# Patient Record
Sex: Female | Born: 1980 | Race: Black or African American | Hispanic: No | Marital: Single | Smoking: Never smoker
Health system: Southern US, Community
[De-identification: ages and names within clinical notes are randomized; demographics above are authoritative.]

## PROBLEM LIST (undated history)

## (undated) DIAGNOSIS — G709 Myoneural disorder, unspecified: Secondary | ICD-10-CM

## (undated) DIAGNOSIS — R569 Unspecified convulsions: Secondary | ICD-10-CM

## (undated) DIAGNOSIS — K279 Peptic ulcer, site unspecified, unspecified as acute or chronic, without hemorrhage or perforation: Secondary | ICD-10-CM

## (undated) HISTORY — DX: Unspecified convulsions: R56.9

## (undated) HISTORY — DX: Myoneural disorder, unspecified: G70.9

## (undated) HISTORY — DX: Peptic ulcer, site unspecified, unspecified as acute or chronic, without hemorrhage or perforation: K27.9

## (undated) HISTORY — PX: APPENDECTOMY: SHX54

---

## 2012-12-10 ENCOUNTER — Telehealth: Payer: Self-pay | Admitting: Family Medicine

## 2012-12-11 ENCOUNTER — Telehealth: Payer: Self-pay | Admitting: Family Medicine

## 2012-12-11 NOTE — Telephone Encounter (Signed)
Appt given for tomorrow

## 2012-12-12 ENCOUNTER — Encounter: Payer: Self-pay | Admitting: Nurse Practitioner

## 2012-12-12 ENCOUNTER — Ambulatory Visit (INDEPENDENT_AMBULATORY_CARE_PROVIDER_SITE_OTHER): Payer: BC Managed Care – PPO | Admitting: Nurse Practitioner

## 2012-12-12 VITALS — BP 107/75 | HR 81 | Temp 98.5°F | Ht 67.0 in | Wt 150.0 lb

## 2012-12-12 DIAGNOSIS — R300 Dysuria: Secondary | ICD-10-CM

## 2012-12-12 DIAGNOSIS — R5381 Other malaise: Secondary | ICD-10-CM

## 2012-12-12 DIAGNOSIS — R631 Polydipsia: Secondary | ICD-10-CM

## 2012-12-12 DIAGNOSIS — R350 Frequency of micturition: Secondary | ICD-10-CM

## 2012-12-12 LAB — POCT URINALYSIS DIPSTICK
Glucose, UA: NEGATIVE
Nitrite, UA: NEGATIVE
Spec Grav, UA: 1.02
Urobilinogen, UA: NEGATIVE

## 2012-12-12 LAB — COMPLETE METABOLIC PANEL WITH GFR
ALT: 8 U/L (ref 0–35)
Albumin: 3.9 g/dL (ref 3.5–5.2)
CO2: 28 mEq/L (ref 19–32)
Calcium: 9.4 mg/dL (ref 8.4–10.5)
Chloride: 102 mEq/L (ref 96–112)
Creat: 0.6 mg/dL (ref 0.50–1.10)
GFR, Est African American: >89 mL/min
Potassium: 4.6 mEq/L (ref 3.5–5.3)
Sodium: 138 mEq/L (ref 135–145)
Total Protein: 7 g/dL (ref 6.0–8.3)

## 2012-12-12 LAB — POCT CBC
HCT, POC: 37.1 % — AB (ref 37.7–47.9)
MCH, POC: 29.8 pg (ref 27–31.2)
MCHC: 33.6 g/dL (ref 31.8–35.4)
MPV: 7.7 fL (ref 0–99.8)
POC Granulocyte: 4 (ref 2–6.9)
POC LYMPH PERCENT: 26.4 %L (ref 10–50)
RDW, POC: 13.3 %
WBC: 5.8 10*3/uL (ref 4.6–10.2)

## 2012-12-12 LAB — POCT UA - MICROSCOPIC ONLY
Crystals, Ur, HPF, POC: NEGATIVE
Yeast, UA: NEGATIVE

## 2012-12-12 LAB — POCT GLYCOSYLATED HEMOGLOBIN (HGB A1C): Hemoglobin A1C: 5.2

## 2012-12-12 NOTE — Progress Notes (Signed)
Subjective:    Patient ID: Jaycelynn Knickerbocker Grivas, female    DOB: [birth year], 32 y.o.   MRN: [id number]  HPI Patient in c/o dysuria that started 9 days ago- Went to urgent care and they said she did not have UTI but had large amounts of glucose in her urine    Review of Systems  Constitutional: Positive for appetite change (increased) and fatigue.  Respiratory: Negative.   Cardiovascular: Negative.   Gastrointestinal: Negative.   Endocrine: Positive for polydipsia, polyphagia and polyuria.  Genitourinary: Positive for dysuria, urgency and frequency.       Nocturia       Objective:   Physical Exam  Constitutional: She is oriented to person, place, and time. She appears well-developed and well-nourished.  HENT:  Nose: Nose normal.  Mouth/Throat: Oropharynx is clear and moist.  Eyes: EOM are normal.  Neck: Trachea normal, normal range of motion and full passive range of motion without pain. Neck supple. No JVD present. Carotid bruit is not present. No thyromegaly present.  Cardiovascular: Normal rate, regular rhythm, normal heart sounds and intact distal pulses.  Exam reveals no gallop and no friction rub.   No murmur heard. Pulmonary/Chest: Effort normal and breath sounds normal.  Abdominal: Soft. Bowel sounds are normal. She exhibits no distension and no mass. There is no tenderness.  Musculoskeletal: Normal range of motion.  Lymphadenopathy:    She has no cervical adenopathy.  Neurological: She is alert and oriented to person, place, and time. She has normal reflexes.  Skin: Skin is warm and dry.  Psychiatric: She has a normal mood and affect. Her behavior is normal. Judgment and thought content normal.    BP 107/75  Pulse 81  Temp(Src) 98.5 F (36.9 C) (Oral)  Ht 5\' 7"  (1.702 m)  Wt 150 lb (68.04 kg)  BMI 23.49 kg/m2  LMP 11/28/2012  Results for orders placed in visit on 12/12/12  POCT CBC      Result Value Range   WBC 5.8  4.6 - 10.2 K/uL   Lymph, poc 1.5  0.6 - 3.4   POC LYMPH PERCENT 26.4  10 - 50 %L   POC Granulocyte 4.0  2 - 6.9   Granulocyte percent 68.2  37 - 80 %G   RBC 4.2  4.04 - 5.48 M/uL   Hemoglobin 12.5  12.2 - 16.2 g/dL   HCT, POC 40.9 (*) 81.1 - 47.9 %   MCV 88.6  80 - 97 fL   MCH, POC 29.8  27 - 31.2 pg   MCHC 33.6  31.8 - 35.4 g/dL   RDW, POC 91.4     Platelet Count, POC 186.0  142 - 424 K/uL   MPV 7.7  0 - 99.8 fL  POCT GLYCOSYLATED HEMOGLOBIN (HGB A1C)      Result Value Range   Hemoglobin A1C 5.2%    POCT UA - MICROSCOPIC ONLY      Result Value Range   WBC, Ur, HPF, POC occ     RBC, urine, microscopic 10-20     Bacteria, U Microscopic mod     Mucus, UA mod     Epithelial cells, urine per micros few     Crystals, Ur, HPF, POC neg     Casts, Ur, LPF, POC occ     Yeast, UA neg    POCT URINALYSIS DIPSTICK      Result Value Range   Color, UA yellow     Clarity, UA clear  Glucose, UA neg     Bilirubin, UA neg     Ketones, UA small     Spec Grav, UA 1.020     Blood, UA trace     pH, UA 7.0     Protein, UA small     Urobilinogen, UA negative     Nitrite, UA neg     Leukocytes, UA Trace          Assessment & Plan:   1. Other malaise and fatigue   2. Dysuria   3. Excessive thirst   4. Frequent urination    Orders Placed This Encounter  Procedures  . COMPLETE METABOLIC PANEL WITH GFR  . Thyroid Panel With TSH  . NMR Lipoprofile with Lipids  . POCT CBC  . POCT glycosylated hemoglobin (Hb A1C)  . POCT UA - Microscopic Only  . POCT urinalysis dipstick   Continue macrobid as ordered AZO OTC if needed Wait and see if symptoms resolve Labs pending  Mary-Margaret Daphine Deutscher, FNP

## 2012-12-12 NOTE — Patient Instructions (Signed)
Urinary Tract Infection  Urinary tract infections (UTIs) can develop anywhere along your urinary tract. Your urinary tract is your body's drainage system for removing wastes and extra water. Your urinary tract includes two kidneys, two ureters, a bladder, and a urethra. Your kidneys are a pair of bean-shaped organs. Each kidney is about the size of your fist. They are located below your ribs, one on each side of your spine.  CAUSES  Infections are caused by microbes, which are microscopic organisms, including fungi, viruses, and bacteria. These organisms are so small that they can only be seen through a microscope. Bacteria are the microbes that most commonly cause UTIs.  SYMPTOMS   Symptoms of UTIs may vary by age and gender of the patient and by the location of the infection. Symptoms in young women typically include a frequent and intense urge to urinate and a painful, burning feeling in the bladder or urethra during urination. Older women and men are more likely to be tired, shaky, and weak and have muscle aches and abdominal pain. A fever may mean the infection is in your kidneys. Other symptoms of a kidney infection include pain in your back or sides below the ribs, nausea, and vomiting.  DIAGNOSIS  To diagnose a UTI, your caregiver will ask you about your symptoms. Your caregiver also will ask to provide a urine sample. The urine sample will be tested for bacteria and white blood cells. White blood cells are made by your body to help fight infection.  TREATMENT   Typically, UTIs can be treated with medication. Because most UTIs are caused by a bacterial infection, they usually can be treated with the use of antibiotics. The choice of antibiotic and length of treatment depend on your symptoms and the type of bacteria causing your infection.  HOME CARE INSTRUCTIONS   If you were prescribed antibiotics, take them exactly as your caregiver instructs you. Finish the medication even if you feel better after you  have only taken some of the medication.   Drink enough water and fluids to keep your urine clear or pale yellow.   Avoid caffeine, tea, and carbonated beverages. They tend to irritate your bladder.   Empty your bladder often. Avoid holding urine for long periods of time.   Empty your bladder before and after sexual intercourse.   After a bowel movement, women should cleanse from front to back. Use each tissue only once.  SEEK MEDICAL CARE IF:    You have back pain.   You develop a fever.   Your symptoms do not begin to resolve within 3 days.  SEEK IMMEDIATE MEDICAL CARE IF:    You have severe back pain or lower abdominal pain.   You develop chills.   You have nausea or vomiting.   You have continued burning or discomfort with urination.  MAKE SURE YOU:    Understand these instructions.   Will watch your condition.   Will get help right away if you are not doing well or get worse.  Document Released: 04/05/2005 Document Revised: 12/26/2011 Document Reviewed: 08/04/2011  [employer] Patient Information 2014 [employer].

## 2012-12-13 LAB — NMR LIPOPROFILE WITH LIPIDS
Cholesterol, Total: 197 mg/dL (ref <200)
HDL Particle Number: 37.9 umol/L (ref >=30.5)
HDL-C: 65 mg/dL (ref >=40)
LDL (calc): 123 mg/dL — ABNORMAL HIGH (ref <100)
LDL Particle Number: 1545 nmol/L — ABNORMAL HIGH (ref <1000)
LP-IR Score: <25 (ref <=45)
Triglycerides: 46 mg/dL (ref <150)
VLDL Size: 33 nm (ref <=46.6)

## 2012-12-16 ENCOUNTER — Ambulatory Visit: Payer: Self-pay | Admitting: Family Medicine

## 2012-12-17 ENCOUNTER — Telehealth: Payer: Self-pay | Admitting: Nurse Practitioner

## 2012-12-17 NOTE — Telephone Encounter (Signed)
Error

## 2012-12-17 NOTE — Telephone Encounter (Signed)
Labs discussed with pt 

## 2013-01-20 ENCOUNTER — Telehealth: Payer: Self-pay

## 2013-01-20 DIAGNOSIS — G40909 Epilepsy, unspecified, not intractable, without status epilepticus: Secondary | ICD-10-CM

## 2013-01-20 NOTE — Telephone Encounter (Signed)
Patient aware referral in progress

## 2013-01-20 NOTE — Telephone Encounter (Signed)
Wants a referral to [facility]

## 2013-01-20 NOTE — Telephone Encounter (Signed)
Referral made 

## 2013-01-20 NOTE — Telephone Encounter (Signed)
She has epilepsy and she needs neurologist to help and she just moved here about 6 months ago

## 2013-01-20 NOTE — Telephone Encounter (Signed)
Needs to know for what?

## 2013-02-20 ENCOUNTER — Ambulatory Visit: Payer: BC Managed Care – PPO | Admitting: Neurology

## 2013-02-28 ENCOUNTER — Encounter: Payer: Self-pay | Admitting: Neurology

## 2013-02-28 ENCOUNTER — Ambulatory Visit (INDEPENDENT_AMBULATORY_CARE_PROVIDER_SITE_OTHER): Payer: BC Managed Care – PPO | Admitting: Neurology

## 2013-02-28 VITALS — BP 112/70 | HR 74 | Ht 67.0 in | Wt 155.0 lb

## 2013-02-28 DIAGNOSIS — G40909 Epilepsy, unspecified, not intractable, without status epilepticus: Secondary | ICD-10-CM | POA: Insufficient documentation

## 2013-02-28 MED ORDER — LEVETIRACETAM 750 MG PO TABS: 750.0000 mg | ORAL_TABLET | Freq: Two times a day (BID) | ORAL | Status: DC

## 2013-02-28 NOTE — Patient Instructions (Addendum)
Overall you are doing fairly well but I do want to suggest a few things today:   Remember to drink plenty of fluid, eat healthy meals and do not skip any meals. Try to eat protein with a every meal and eat a healthy snack such as fruit or nuts in between meals. Try to keep a regular sleep-wake schedule and try to exercise daily, particularly in the form of walking, 20-30 minutes a day, if you can.   As far as your medications are concerned, I would like to suggest starting a medication called called Keppra. Start by taking 750mg  twice a day. We will also taper you off the Depakote using the following schedule:  Week 1: Take Keppra 750mg  twice a day AND Depakote 500mg  twice a day Week 2: Take Keppra 750mg  twice a day AND Depakote 500mg  once a day Week 3: Take Keppra 750mg  twice a day and Depakote 500mg  every other day Week 4: Take Keppra 750mg  twice a day and discontinue Depakote  I would like to see you back in 6 months, sooner if we need to. Please call us with any interim questions, concerns, problems, updates or refill requests.   My clinical assistant and will answer any of your questions and relay your messages to me and also relay most of my messages to you.   Our phone number is [phone]. We also have an after hours call service for urgent matters and there is a physician on-call for urgent questions. For any emergencies you know to call 911 or go to the nearest emergency room

## 2013-02-28 NOTE — Progress Notes (Signed)
[facility]  Provider:  Dr Hosie Poisson Referring Provider: Bennie Pierini, * Primary Care Physician:  [name], MD  CC:  seizures  HPI:  Jeanette Gomez is a 32 y.o. female here as a referral from Dr. Daphine Deutscher for history of epilepsy  She reports being diagnosed with seizures around 25 years ago, describes them as generalized tonic-clonic in nature. She has not had a seizure in over 10 years. Prior to being well controlled to typically run 1 seizure per week, thinks it lasted around one minute. She describes it as generalized, but notes that she was awake and conscious during the whole event. She will to hear people talking but would be unable to communicate with them. She was initially followed by Dr. Sharene Skeans, head imaging EEG reports which per the patient were unremarkable. She was initially tried on May oral medications and switch to another pill, is unsure of the names of these medications but states it did not work. At some point she was started on Depakote 500 mg daily, noted some benefit with this and was then increased to 1 g daily. She has been overall seizure free since being on this medication.  She does express some concern over the possibility of getting pregnant and the risks of Depakote and would like to discuss this. Currently has no plans of getting pregnant but doesn't future. Has a family history of seizures, no febrile seizures. No history of head trauma.  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 Denies alcohol or tobacco use. Reports she had blood work around a year ago which was reported to be normal.  On VPA ER Review of Systems: Out of a complete 14 system review, the patient complains of only the following symptoms, and all other reviewed systems are negative. Other for fatigue feeling cold joint pain seizure  History   Social History  . Marital Status: Single    Spouse Name: N/A    Number of Children: 0  . 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 Smoking status: Never Smoker   . Smokeless tobacco: Never Used  . Alcohol Use: No  . Drug Use: No  . Sexual Activity: Not on file   Other Topics Concern  . Not on file   Social History Narrative   Patient is single and lives at home with her mother. Patient has a college education. B.S. 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   Caffeine- Four times  A week - Coffee    Family History  Problem Relation Age of Onset  . Hyperlipidemia Mother   . Hypertension Mother   . Stroke Father   . Diabetes Maternal Grandmother     Past Medical History  Diagnosis Date  . Seizures   . Neuromuscular disorder     myofaciasal pain    Past Surgical History  Procedure Laterality Date  . Appendectomy      Current Outpatient Prescriptions  Medication Sig Dispense Refill  . divalproex (DEPAKOTE ER) 500 MG 24 hr tablet Take 1,000 mg by mouth daily.      . traMADol (ULTRAM) 50 MG tablet Take 100 mg by mouth every 4 (four) hours as needed for pain.       No current facility-administered medications for this visit.    Allergies as of 02/28/2013 - Review Complete 02/28/2013  Allergen Reaction Noted  . Penicillins  12/12/2012  Vitals: BP 112/70  Pulse 74  Ht 5\' 7"  (1.702 m)  Wt 155 lb (70.308 kg)  BMI 24.27 kg/m2 Last Weight:  Wt Readings from Last 1 Encounters:  02/28/13 155 lb (70.308 kg)   Last Height:   Ht Readings from Last 1 Encounters:  02/28/13 5\' 7"  (1.702 m)     Physical exam: Exam: Gen: NAD, conversant Eyes: anicteric sclerae, moist conjunctivae HENT: Atraumati Neck: Trachea midline; supple,  Lungs: CTA, no wheezing, rales, rhonic                          CV: RRR, no MRG Abdomen: Soft, non-tender;  Extremities: No peripheral edema  Skin: Normal temperature, no rash,  Psych: Appropriate affect, pleasant  Neuro: MS: AA&Ox3, appropriately interactive, normal affect    Speech: fluent w/o paraphasic error  Memory: good recent and remote recall  CN: PERRL, EOMI no nystagmus, no ptosis, sensation intact to LT V1-V3 bilat, face symmetric, no weakness, hearing grossly intact, palate elevates symmetrically, shoulder shrug 5/5 bilat,  tongue protrudes midline, no fasiculations noted.  Motor: normal bulk and tone Strength: 5/5  In all extremities  Coord: rapid alternating and point-to-point (FNF, HTS) movements intact.  Reflexes: symmetrical, bilat downgoing toes  Sens: LT intact in all extremities  Gait: posture, stance, stride and arm-swing normal. Tandem gait intact. Able to walk on heels and toes. Romberg absent.   Assessment:  After physical and neurologic examination, review of laboratory studies, imaging, neurophysiology testing and pre-existing records, assessment will be reviewed on the problem list.  Plan:  Treatment plan and additional workup will be reviewed under Problem List.  Ms. Jeanette Gomez is a pleasant 32 year old African American woman who presents for initial evaluation of a history of epilepsy. She has been seizure-free for over 10 years on a regimen of Depakote 500 mg ER twice a day. Her physical exam is unremarkable this time. The etiology of her underlying seizure disorder is unclear, she reports having had a normal brain imaging and EEG. At today's visit she expresses concern over the possibility return in the future and the risk of being on Depakote versus the risks of coming off in her breakthrough seizures. After a long discussion we decided the benefit of trying to switch her to medication such as Keppra was work the risk.  1)Epilepsy -Will slowly transition patient from Depakote to Keppra 750 mg twice a day -Patient scheduled to followup in 6 months, earlier if needed.

## 2013-04-02 ENCOUNTER — Encounter: Payer: Self-pay | Admitting: General Practice

## 2013-04-02 ENCOUNTER — Ambulatory Visit (INDEPENDENT_AMBULATORY_CARE_PROVIDER_SITE_OTHER): Payer: BC Managed Care – PPO | Admitting: General Practice

## 2013-04-02 VITALS — BP 110/68 | HR 70 | Temp 97.9°F | Ht 67.0 in | Wt 151.0 lb

## 2013-04-02 DIAGNOSIS — J309 Allergic rhinitis, unspecified: Secondary | ICD-10-CM

## 2013-04-02 DIAGNOSIS — J029 Acute pharyngitis, unspecified: Secondary | ICD-10-CM

## 2013-04-02 DIAGNOSIS — J302 Other seasonal allergic rhinitis: Secondary | ICD-10-CM

## 2013-04-02 DIAGNOSIS — R509 Fever, unspecified: Secondary | ICD-10-CM

## 2013-04-02 LAB — POCT RAPID STREP A (OFFICE): Rapid Strep A Screen: NEGATIVE

## 2013-04-02 LAB — POCT INFLUENZA A/B: Influenza B, POC: NEGATIVE

## 2013-04-02 MED ORDER — CETIRIZINE HCL 10 MG PO TABS: 10.0000 mg | ORAL_TABLET | Freq: Every day | ORAL | Status: DC

## 2013-04-02 NOTE — Progress Notes (Signed)
  Subjective:    Patient ID: Jeanette Gomez, female    DOB: [birth year], 32 y.o.   MRN: [id number]  Fever  This is a new problem. The current episode started today. The problem occurs constantly. The problem has been unchanged. Her temperature was unmeasured prior to arrival. The temperature was taken using an oral thermometer. Associated symptoms include muscle aches and a sore throat. Pertinent negatives include no abdominal pain, chest pain, congestion, coughing, ear pain, headaches, nausea or rash. She has tried nothing for the symptoms.      Review of Systems  Constitutional: Positive for fever and chills.  HENT: Positive for sore throat. Negative for ear pain, congestion, rhinorrhea, neck pain, neck stiffness and postnasal drip.   Respiratory: Negative for cough and chest tightness.   Cardiovascular: Negative for chest pain and palpitations.  Gastrointestinal: Negative for nausea and abdominal pain.  Genitourinary: Negative for dysuria and difficulty urinating.  Skin: Negative for rash.  Neurological: Negative for dizziness, weakness and headaches.       Objective:   Physical Exam  Constitutional: She is oriented to person, place, and time. She appears well-developed and well-nourished.  HENT:  Head: Normocephalic and atraumatic.  Right Ear: External ear normal.  Left Ear: External ear normal.  Mouth/Throat: Posterior oropharyngeal erythema present.  Cardiovascular: Normal rate, regular rhythm and normal heart sounds.   Pulmonary/Chest: Effort normal and breath sounds normal. No respiratory distress. She exhibits no tenderness.  Neurological: She is alert and oriented to person, place, and time.  Skin: Skin is warm and dry.  Psychiatric: She has a normal mood and affect.    Results for orders placed in visit on 04/02/13  POCT INFLUENZA A/B      Result Value Range   Influenza A, POC Negative     Influenza B, POC Negative    POCT RAPID STREP A (OFFICE)      Result Value  Range   Rapid Strep A Screen Negative  Negative        Assessment & Plan:  1. Fever and chills - POCT Influenza A/B - POCT rapid strep A  2. Sore throat - POCT Influenza A/B - POCT rapid strep A  3. Seasonal allergies - cetirizine (ZYRTEC) 10 MG tablet; Take 1 tablet (10 mg total) by mouth daily.  Dispense: 30 tablet; Refill: 6 -RTO if symptoms worsen or unresolved -Patient verbalized understanding Coralie Keens, FNP-C

## 2013-05-15 ENCOUNTER — Other Ambulatory Visit: Payer: Self-pay

## 2013-09-03 ENCOUNTER — Ambulatory Visit: Payer: BC Managed Care – PPO | Admitting: Neurology

## 2013-09-19 ENCOUNTER — Ambulatory Visit (INDEPENDENT_AMBULATORY_CARE_PROVIDER_SITE_OTHER): Payer: BC Managed Care – PPO | Admitting: Neurology

## 2013-09-19 ENCOUNTER — Encounter: Payer: Self-pay | Admitting: Neurology

## 2013-09-19 VITALS — BP 118/63 | HR 86 | Ht 67.0 in | Wt 139.0 lb

## 2013-09-19 DIAGNOSIS — G40909 Epilepsy, unspecified, not intractable, without status epilepticus: Secondary | ICD-10-CM

## 2013-09-19 DIAGNOSIS — R51 Headache: Secondary | ICD-10-CM

## 2013-09-19 NOTE — Patient Instructions (Addendum)
Overall you are doing fairly well but I do want to suggest a few things today:   As far as your medications are concerned, I would like to suggest continuing you on keppra 750mg  twice a day. Please call us if you need a refill in the future  I would like to see you back in 12 months, sooner if we need to. Please call us with any interim questions, concerns, problems, updates or refill requests.   Please also call us for any test results so we can go over those with you on the phone.  My clinical assistant and will answer any of your questions and relay your messages to me and also relay most of my messages to you.   Our phone number is [phone]. We also have an after hours call service for urgent matters and there is a physician on-call for urgent questions. For any emergencies you know to call 911 or go to the nearest emergency room

## 2013-09-19 NOTE — Progress Notes (Signed)
[facility]  Provider:  Dr Hosie Poisson Referring Provider: [name], MD Primary Care Physician:  [name], MD  CC:  seizures  HPI:  Jeanette Gomez is a 33 y.o. female here as a follow up from Dr. Christell Constant for history of epilepsy. Last visit was 8.2104 at which time she was transitioned from VPA to keppra 750mg  BID. Tolerated transition well, no breakthrough seizure activity. Last seizure >10years ago. Has noted an increase in headaches but states they are occuring once every other month and are not bothersome at this point.    Initial visit 02/2013: She reports being diagnosed with seizures around 25 years ago, describes them as generalized tonic-clonic in nature. She has not had a seizure in over 10 years. Prior to being well controlled to typically run 1 seizure per week, thinks it lasted around one minute. She describes it as generalized, but notes that she was awake and conscious during the whole event. She will to hear people talking but would be unable to communicate with them. She was initially followed by Dr. Sharene Skeans, head imaging EEG reports which per the patient were unremarkable. She was initially tried on May oral medications and switch to another pill, is unsure of the names of these medications but states it did not work. At some point she was started on Depakote 500 mg daily, noted some benefit with this and was then increased to 1 g daily. She has been overall seizure free since being on this medication.  She does express some concern over the possibility of getting pregnant and the risks of Depakote and would like to discuss this. Currently has no plans of getting pregnant but doesn't future. Has a family history of seizures, no febrile seizures. No history of head trauma.  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 Denies alcohol or tobacco use. Reports she had blood work around a year ago which was reported to be normal.  On VPA ER Review of Systems: Out  of a complete 14 system review, the patient complains of only the following symptoms, and all other reviewed systems are negative. Other for fatigue feeling cold joint pain seizure + for fatigue, headaches, seizures, neck pain  History   Social History  . Marital Status: Single    Spouse Name: N/A    Number of Children: 0  . 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 Smoking status: Never Smoker   . Smokeless tobacco: Never Used  . Alcohol Use: No  . Drug Use: No  . Sexual Activity: Not on file   Other Topics Concern  . Not on file   Social History Narrative   Patient is single and lives at home with her mother. Patient has a college education. B.S. 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   Caffeine- Four times  A week - Coffee    Family History  Problem Relation Age of Onset  . Hyperlipidemia Mother   . Hypertension Mother   . Stroke Father   . Diabetes Maternal Grandmother     Past Medical History  Diagnosis Date  . Seizures   . Neuromuscular disorder     myofaciasal pain    Past Surgical History  Procedure Laterality Date  . Appendectomy      Current Outpatient Prescriptions  Medication Sig Dispense Refill  . levETIRAcetam (KEPPRA) 750 MG tablet Take 1 tablet (  750 mg total) by mouth 2 (two) times daily.  120 tablet  11  . traMADol (ULTRAM) 50 MG tablet Take 100 mg by mouth every 4 (four) hours as needed for pain.       No current facility-administered medications for this visit.    Allergies as of 09/19/2013 - Review Complete 09/19/2013  Allergen Reaction Noted  . Penicillins  12/12/2012    Vitals: BP 118/63  Pulse 86  Ht 5\' 7"  (1.702 m)  Wt 139 lb (63.05 kg)  BMI 21.77 kg/m2 Last Weight:  Wt Readings from Last 1 Encounters:  09/19/13 139 lb (63.05 kg)   Last Height:   Ht Readings from Last 1 Encounters:  09/19/13 5\' 7"  (1.702 m)     Physical exam: Exam: Gen:  NAD, conversant Eyes: anicteric sclerae, moist conjunctivae HENT: Atraumati Neck: Trachea midline; supple,  Lungs: CTA, no wheezing, rales, rhonic                          CV: RRR, no MRG Abdomen: Soft, non-tender;  Extremities: No peripheral edema  Skin: Normal temperature, no rash,  Psych: Appropriate affect, pleasant  Neuro: MS: AA&Ox3, appropriately interactive, normal affect   Speech: fluent w/o paraphasic error  Memory: good recent and remote recall  CN: PERRL, EOMI no nystagmus, no ptosis, sensation intact to LT V1-V3 bilat, face symmetric, no weakness, hearing grossly intact, palate elevates symmetrically, shoulder shrug 5/5 bilat,  tongue protrudes midline, no fasiculations noted.  Motor: normal bulk and tone Strength: 5/5  In all extremities  Coord: rapid alternating and point-to-point (FNF, HTS) movements intact.  Reflexes: symmetrical, bilat downgoing toes  Sens: LT intact in all extremities  Gait: posture, stance, stride and arm-swing normal. Tandem gait intact. Able to walk on heels and toes. Romberg absent.   Assessment:  After physical and neurologic examination, review of laboratory studies, imaging, neurophysiology testing and pre-existing records, assessment will be reviewed on the problem list.  Plan:  Treatment plan and additional workup will be reviewed under Problem List.  1)Epilepsy 2)Headaches  Jeanette Gomez is a pleasant 33 year old [place] who presents for follow up evaluation of a history of epilepsy. She has been seizure-free for over 10 years on a regimen of Depakote 500 mg ER and most recently Keppra 750mg  twice a day. Tolerated switch from VPA to Keppra well. Her physical exam is unremarkable this time. The etiology of her underlying seizure disorder is unclear, she reports having had a normal brain imaging and EEG. No medication changes at this time. Can follow up in 12 months or earlier if indicated.

## 2013-10-28 ENCOUNTER — Ambulatory Visit (INDEPENDENT_AMBULATORY_CARE_PROVIDER_SITE_OTHER): Payer: BC Managed Care – PPO

## 2013-10-28 ENCOUNTER — Ambulatory Visit (INDEPENDENT_AMBULATORY_CARE_PROVIDER_SITE_OTHER): Payer: BC Managed Care – PPO | Admitting: Family Medicine

## 2013-10-28 ENCOUNTER — Encounter: Payer: Self-pay | Admitting: Family Medicine

## 2013-10-28 VITALS — BP 103/65 | HR 71 | Temp 98.4°F | Ht 67.0 in | Wt 140.0 lb

## 2013-10-28 DIAGNOSIS — S72009A Fracture of unspecified part of neck of unspecified femur, initial encounter for closed fracture: Secondary | ICD-10-CM

## 2013-10-28 DIAGNOSIS — M25559 Pain in unspecified hip: Secondary | ICD-10-CM

## 2013-10-28 DIAGNOSIS — M25552 Pain in left hip: Secondary | ICD-10-CM

## 2013-10-28 MED ORDER — NAPROXEN 500 MG PO TABS: 500.0000 mg | ORAL_TABLET | Freq: Two times a day (BID) | ORAL | Status: DC

## 2013-10-28 MED ORDER — HYDROCODONE-ACETAMINOPHEN 5-325 MG PO TABS: 1.0000 | ORAL_TABLET | Freq: Four times a day (QID) | ORAL | Status: DC | PRN

## 2013-10-28 NOTE — Progress Notes (Signed)
   Subjective:    Patient ID: Jeanette Beltoniffany E Gomez, female    DOB: 01/29/1981, 33 y.o.   MRN: [id number]  HPI  This 33 y.o. female presents for evaluation of left hip injury.  She did this a few weeks ago in dance And she felt something tear in her left hip during dance.  She has had progressive pain and decreased ROM and difficulty with walking due to the left hip pain.  She states the pain is now moving into the left knee.  Review of Systems C/o left knee and hip pain. No chest pain, SOB, HA, dizziness, vision change, N/V, diarrhea, constipation, dysuria, urinary urgency or frequencyor rash.     Objective:   Physical Exam  Vital signs noted  Well developed well nourished female.  HEENT - Head atraumatic Normocephalic                Eyes - PERRLA, Conjuctiva - clear Sclera- Clear EOMI                Ears - EAC's Wnl TM's Wnl Gross Hearing WNL                Throat - oropharanx wnl Respiratory - Lungs CTA bilateral Cardiac - RRR S1 and S2 without murmur GI - Abdomen soft Nontender and bowel sounds active x 4 MS - TTP left greater trochanter and decreased ROM left hip  Xray left hip - Possible avulsion fx left greater trochanter      Assessment & Plan:  Left hip pain - Plan: DG Hip Complete Left, MR Hip Left Wo Contrast, Ambulatory referral to Orthopedic Surgery, HYDROcodone-acetaminophen (NORCO) 5-325 MG per tablet, naproxen (NAPROSYN) 500 MG tablet  Avulsion fracture of hip - Plan: DG Hip Complete Left, MR Hip Left Wo Contrast, Ambulatory referral to Orthopedic Surgery, HYDROcodone-acetaminophen (NORCO) 5-325 MG per tablet, naproxen (NAPROSYN) 500 MG tablet  Deatra CanterWilliam J Tyland Klemens FNP

## 2013-11-14 ENCOUNTER — Ambulatory Visit: Payer: BC Managed Care – PPO | Attending: Orthopedic Surgery | Admitting: Physical Therapy

## 2013-11-14 DIAGNOSIS — R262 Difficulty in walking, not elsewhere classified: Secondary | ICD-10-CM | POA: Insufficient documentation

## 2013-11-14 DIAGNOSIS — R5381 Other malaise: Secondary | ICD-10-CM | POA: Diagnosis not present

## 2013-11-14 DIAGNOSIS — M25559 Pain in unspecified hip: Secondary | ICD-10-CM | POA: Diagnosis not present

## 2013-11-14 DIAGNOSIS — G40909 Epilepsy, unspecified, not intractable, without status epilepticus: Secondary | ICD-10-CM | POA: Insufficient documentation

## 2013-11-14 DIAGNOSIS — IMO0001 Reserved for inherently not codable concepts without codable children: Secondary | ICD-10-CM | POA: Diagnosis present

## 2013-11-17 ENCOUNTER — Encounter: Payer: BC Managed Care – PPO | Admitting: Physical Therapy

## 2013-11-17 ENCOUNTER — Ambulatory Visit: Payer: BC Managed Care – PPO | Admitting: Physical Therapy

## 2013-11-17 DIAGNOSIS — IMO0001 Reserved for inherently not codable concepts without codable children: Secondary | ICD-10-CM | POA: Diagnosis not present

## 2013-11-20 ENCOUNTER — Ambulatory Visit: Payer: BC Managed Care – PPO | Admitting: Physical Therapy

## 2013-11-20 DIAGNOSIS — IMO0001 Reserved for inherently not codable concepts without codable children: Secondary | ICD-10-CM | POA: Diagnosis not present

## 2013-11-21 ENCOUNTER — Ambulatory Visit: Payer: BC Managed Care – PPO | Admitting: Physical Therapy

## 2013-11-24 ENCOUNTER — Encounter: Payer: BC Managed Care – PPO | Admitting: Physical Therapy

## 2013-11-24 ENCOUNTER — Ambulatory Visit: Payer: BC Managed Care – PPO | Admitting: Physical Therapy

## 2013-11-24 DIAGNOSIS — IMO0001 Reserved for inherently not codable concepts without codable children: Secondary | ICD-10-CM | POA: Diagnosis not present

## 2013-11-26 ENCOUNTER — Encounter: Payer: BC Managed Care – PPO | Admitting: Physical Therapy

## 2013-12-02 ENCOUNTER — Ambulatory Visit: Payer: BC Managed Care – PPO | Admitting: Physical Therapy

## 2013-12-02 DIAGNOSIS — IMO0001 Reserved for inherently not codable concepts without codable children: Secondary | ICD-10-CM | POA: Diagnosis not present

## 2013-12-04 ENCOUNTER — Ambulatory Visit: Payer: BC Managed Care – PPO | Admitting: Physical Therapy

## 2013-12-04 DIAGNOSIS — IMO0001 Reserved for inherently not codable concepts without codable children: Secondary | ICD-10-CM | POA: Diagnosis not present

## 2013-12-09 ENCOUNTER — Ambulatory Visit: Payer: BC Managed Care – PPO | Attending: Orthopedic Surgery | Admitting: *Deleted

## 2013-12-09 DIAGNOSIS — R5381 Other malaise: Secondary | ICD-10-CM | POA: Insufficient documentation

## 2013-12-09 DIAGNOSIS — G40909 Epilepsy, unspecified, not intractable, without status epilepticus: Secondary | ICD-10-CM | POA: Diagnosis not present

## 2013-12-09 DIAGNOSIS — IMO0001 Reserved for inherently not codable concepts without codable children: Secondary | ICD-10-CM | POA: Diagnosis present

## 2013-12-09 DIAGNOSIS — M25559 Pain in unspecified hip: Secondary | ICD-10-CM | POA: Insufficient documentation

## 2013-12-09 DIAGNOSIS — R262 Difficulty in walking, not elsewhere classified: Secondary | ICD-10-CM | POA: Diagnosis not present

## 2013-12-10 ENCOUNTER — Ambulatory Visit (INDEPENDENT_AMBULATORY_CARE_PROVIDER_SITE_OTHER): Payer: BC Managed Care – PPO | Admitting: Family Medicine

## 2013-12-10 ENCOUNTER — Encounter: Payer: Self-pay | Admitting: Family Medicine

## 2013-12-10 ENCOUNTER — Encounter: Payer: Self-pay | Admitting: *Deleted

## 2013-12-10 ENCOUNTER — Ambulatory Visit (INDEPENDENT_AMBULATORY_CARE_PROVIDER_SITE_OTHER): Payer: BC Managed Care – PPO

## 2013-12-10 VITALS — BP 104/63 | HR 82 | Temp 99.2°F | Ht 67.0 in | Wt 140.0 lb

## 2013-12-10 DIAGNOSIS — M79601 Pain in right arm: Secondary | ICD-10-CM

## 2013-12-10 DIAGNOSIS — M79609 Pain in unspecified limb: Secondary | ICD-10-CM

## 2013-12-10 DIAGNOSIS — G40909 Epilepsy, unspecified, not intractable, without status epilepticus: Secondary | ICD-10-CM

## 2013-12-10 DIAGNOSIS — IMO0001 Reserved for inherently not codable concepts without codable children: Secondary | ICD-10-CM

## 2013-12-10 DIAGNOSIS — M7918 Myalgia, other site: Secondary | ICD-10-CM | POA: Insufficient documentation

## 2013-12-10 MED ORDER — RANITIDINE HCL 150 MG PO CAPS: 150.0000 mg | ORAL_CAPSULE | Freq: Two times a day (BID) | ORAL | Status: DC

## 2013-12-10 MED ORDER — MELOXICAM 7.5 MG PO TABS: 7.5000 mg | ORAL_TABLET | Freq: Every day | ORAL | Status: DC

## 2013-12-10 NOTE — Progress Notes (Signed)
   Subjective:    Patient ID: Jeanette Gomez, female    DOB: 08-02-1980, 33 y.o.   MRN: [id number]  HPI Patient here today for right arm pain and "burning". This started this morning when she woke up and has gotten worse throughout the day. Patient is seeing Dr Ranell Patrick for torn tendon to left hip and is using crutches. She doesn't recall any injury to right arm. She is currently taking Tramadol 8 per day. She has a GI intolerance to NSAIDS.         Patient Active Problem List   Diagnosis Date Noted  . Epilepsy 02/28/2013   Outpatient Encounter Prescriptions as of 12/10/2013  Medication Sig  . levETIRAcetam (KEPPRA) 750 MG tablet Take 1 tablet (750 mg total) by mouth 2 (two) times daily.  . traMADol (ULTRAM) 50 MG tablet Take 100 mg by mouth every 4 (four) hours as needed for pain.  . [DISCONTINUED] HYDROcodone-acetaminophen (NORCO) 5-325 MG per tablet Take 1 tablet by mouth every 6 (six) hours as needed for moderate pain.  . [DISCONTINUED] naproxen (NAPROSYN) 500 MG tablet Take 1 tablet (500 mg total) by mouth 2 (two) times daily with a meal.    Review of Systems  Constitutional: Negative.   HENT: Negative.   Eyes: Negative.   Respiratory: Negative.   Cardiovascular: Negative.   Gastrointestinal: Negative.   Endocrine: Negative.   Genitourinary: Negative.   Musculoskeletal: Positive for arthralgias (right arm pain and "burning").  Skin: Negative.   Allergic/Immunologic: Negative.   Neurological: Negative.   Hematological: Negative.   Psychiatric/Behavioral: Negative.        Objective:   Physical Exam  Constitutional: She is oriented to person, place, and time. She appears well-developed and well-nourished. She appears distressed.  HENT:  Head: Normocephalic.  Eyes: Conjunctivae and EOM are normal. Pupils are equal, round, and reactive to light.  Cardiovascular: Intact distal pulses.   Musculoskeletal: She exhibits tenderness.  Has tenderness of right deltoid muscle  and pain with sharp movements  Neurological: She is alert and oriented to person, place, and time.  Skin: Skin is warm and dry. No rash noted.  Psychiatric: She has a normal mood and affect. Her behavior is normal. Judgment and thought content normal.   BP 104/63  Pulse 82  Temp(Src) 99.2 F (37.3 C) (Oral)  Ht 5\' 7"  (1.702 m)  Wt 140 lb (63.504 kg)  BMI 21.92 kg/m2  LMP 11/19/2013 [facility] reading (PRIMARY) by  Dr. [name]- calcific tendons? No acute fracture                                 Assessment & Plan:  1. Right arm pain - DG Shoulder Right; Future  2. Myofascial pain syndrome  3. Epilepsy  Patient Instructions  Ice for 48 hours and use warm wet compresses Use sling to right arm Take meds as directed  If med upsets your stomach - please stop the meds Follow up with Dr Ranell Patrick as directed    Nyra Capes MD

## 2013-12-10 NOTE — Patient Instructions (Signed)
Ice for 48 hours and use warm wet compresses Use sling to right arm Take meds as directed  If med upsets your stomach - please stop the meds Follow up with Dr Ranell Patrick as directed

## 2013-12-15 ENCOUNTER — Ambulatory Visit: Payer: BC Managed Care – PPO | Admitting: Physical Therapy

## 2013-12-15 DIAGNOSIS — IMO0001 Reserved for inherently not codable concepts without codable children: Secondary | ICD-10-CM | POA: Diagnosis not present

## 2013-12-18 ENCOUNTER — Ambulatory Visit: Payer: BC Managed Care – PPO | Admitting: Physical Therapy

## 2013-12-18 DIAGNOSIS — IMO0001 Reserved for inherently not codable concepts without codable children: Secondary | ICD-10-CM | POA: Diagnosis not present

## 2013-12-22 ENCOUNTER — Ambulatory Visit: Payer: BC Managed Care – PPO | Admitting: Physical Therapy

## 2013-12-22 DIAGNOSIS — IMO0001 Reserved for inherently not codable concepts without codable children: Secondary | ICD-10-CM | POA: Diagnosis not present

## 2013-12-24 ENCOUNTER — Ambulatory Visit: Payer: BC Managed Care – PPO | Admitting: Physical Therapy

## 2013-12-24 DIAGNOSIS — IMO0001 Reserved for inherently not codable concepts without codable children: Secondary | ICD-10-CM | POA: Diagnosis not present

## 2013-12-25 ENCOUNTER — Encounter: Payer: BC Managed Care – PPO | Admitting: Physical Therapy

## 2013-12-29 ENCOUNTER — Ambulatory Visit: Payer: BC Managed Care – PPO | Admitting: *Deleted

## 2013-12-29 DIAGNOSIS — IMO0001 Reserved for inherently not codable concepts without codable children: Secondary | ICD-10-CM | POA: Diagnosis not present

## 2014-01-05 ENCOUNTER — Ambulatory Visit: Payer: BC Managed Care – PPO | Admitting: Physical Therapy

## 2014-01-05 DIAGNOSIS — IMO0001 Reserved for inherently not codable concepts without codable children: Secondary | ICD-10-CM | POA: Diagnosis not present

## 2014-01-08 ENCOUNTER — Ambulatory Visit: Payer: BC Managed Care – PPO | Attending: Orthopedic Surgery | Admitting: Physical Therapy

## 2014-01-08 DIAGNOSIS — R5381 Other malaise: Secondary | ICD-10-CM | POA: Diagnosis not present

## 2014-01-08 DIAGNOSIS — R262 Difficulty in walking, not elsewhere classified: Secondary | ICD-10-CM | POA: Diagnosis not present

## 2014-01-08 DIAGNOSIS — G40909 Epilepsy, unspecified, not intractable, without status epilepticus: Secondary | ICD-10-CM | POA: Diagnosis not present

## 2014-01-08 DIAGNOSIS — M25559 Pain in unspecified hip: Secondary | ICD-10-CM | POA: Insufficient documentation

## 2014-01-08 DIAGNOSIS — IMO0001 Reserved for inherently not codable concepts without codable children: Secondary | ICD-10-CM | POA: Diagnosis present

## 2014-01-12 ENCOUNTER — Ambulatory Visit: Payer: BC Managed Care – PPO | Admitting: Physical Therapy

## 2014-01-12 ENCOUNTER — Encounter: Payer: BC Managed Care – PPO | Admitting: Physical Therapy

## 2014-01-12 DIAGNOSIS — IMO0001 Reserved for inherently not codable concepts without codable children: Secondary | ICD-10-CM | POA: Diagnosis not present

## 2014-01-15 ENCOUNTER — Ambulatory Visit: Payer: BC Managed Care – PPO | Admitting: Physical Therapy

## 2014-01-15 DIAGNOSIS — IMO0001 Reserved for inherently not codable concepts without codable children: Secondary | ICD-10-CM | POA: Diagnosis not present

## 2014-01-19 ENCOUNTER — Encounter: Payer: BC Managed Care – PPO | Admitting: Physical Therapy

## 2014-01-22 ENCOUNTER — Encounter: Payer: BC Managed Care – PPO | Admitting: Physical Therapy

## 2014-01-26 ENCOUNTER — Ambulatory Visit: Payer: BC Managed Care – PPO | Admitting: Physical Therapy

## 2014-01-26 DIAGNOSIS — IMO0001 Reserved for inherently not codable concepts without codable children: Secondary | ICD-10-CM | POA: Diagnosis not present

## 2014-01-29 ENCOUNTER — Ambulatory Visit: Payer: BC Managed Care – PPO | Admitting: Physical Therapy

## 2014-01-29 DIAGNOSIS — IMO0001 Reserved for inherently not codable concepts without codable children: Secondary | ICD-10-CM | POA: Diagnosis not present

## 2014-03-10 ENCOUNTER — Telehealth: Payer: Self-pay | Admitting: Family Medicine

## 2014-03-11 NOTE — Telephone Encounter (Signed)
Patient has seen [facility] and was given a injection 2 months ago and I advised patient since she is seeing [facility] for this she needs to follow up with them.

## 2014-04-14 ENCOUNTER — Other Ambulatory Visit: Payer: Self-pay | Admitting: Neurology

## 2014-04-14 NOTE — Telephone Encounter (Signed)
[facility]

## 2014-04-24 ENCOUNTER — Other Ambulatory Visit: Payer: Self-pay

## 2014-04-27 ENCOUNTER — Ambulatory Visit (INDEPENDENT_AMBULATORY_CARE_PROVIDER_SITE_OTHER): Payer: BC Managed Care – PPO | Admitting: Nurse Practitioner

## 2014-04-27 ENCOUNTER — Encounter: Payer: Self-pay | Admitting: Nurse Practitioner

## 2014-04-27 VITALS — BP 110/68 | HR 73 | Temp 100.4°F | Ht 67.0 in | Wt 139.0 lb

## 2014-04-27 DIAGNOSIS — J029 Acute pharyngitis, unspecified: Secondary | ICD-10-CM

## 2014-04-27 DIAGNOSIS — R509 Fever, unspecified: Secondary | ICD-10-CM

## 2014-04-27 LAB — POCT RAPID STREP A (OFFICE): Rapid Strep A Screen: NEGATIVE

## 2014-04-27 LAB — POCT INFLUENZA A/B
Influenza A, POC: NEGATIVE
Influenza B, POC: NEGATIVE

## 2014-04-27 NOTE — Patient Instructions (Signed)

## 2014-04-27 NOTE — Progress Notes (Signed)
   Subjective:    Patient ID: Jeanette Gomez, female    DOB: 08/31/1980, 33 y.o.   MRN: [id number]  HPI Patient was out of town on a trip over the weekend- she said that she felt like she was having an allergic reaction to something and throat was closing up- She had eaten 2 bites of potato salad and suddenly felt like her throat was closing up. Denies trouble breathing, but had to breathe through her nose. SHe did not go to the hospital- Today her throat still feels funny and she is running a low grade fever.    Review of Systems  Constitutional: Negative.   HENT: Positive for congestion and sore throat.   Respiratory: Negative.  Negative for cough.   Cardiovascular: Negative.   Gastrointestinal: Negative.   Genitourinary: Negative.   Neurological: Negative.   Psychiatric/Behavioral: Negative.        Objective:   Physical Exam  Constitutional: She appears well-developed and well-nourished. No distress.  HENT:  Right Ear: Hearing, tympanic membrane, external ear and ear canal normal.  Left Ear: Hearing, tympanic membrane, external ear and ear canal normal.  Nose: Mucosal edema present. Right sinus exhibits no maxillary sinus tenderness and no frontal sinus tenderness. Left sinus exhibits no maxillary sinus tenderness and no frontal sinus tenderness.  Mouth/Throat: Uvula is midline, oropharynx is clear and moist and mucous membranes are normal.  Neck: Normal range of motion. Neck supple.  Cardiovascular: Normal rate and normal heart sounds.   Pulmonary/Chest: Effort normal and breath sounds normal.  Abdominal: Soft. Bowel sounds are normal.  Lymphadenopathy:    She has no cervical adenopathy.  Neurological: She is alert.  Skin: Skin is warm.  Psychiatric: She has a normal mood and affect. Her behavior is normal. Judgment and thought content normal.   BP 110/68  Pulse 73  Temp(Src) 100.4 F (38 C) (Oral)  Ht 5\' 7"  (1.702 m)  Wt 139 lb (63.05 kg)  BMI 21.77 kg/m2  LMP  04/13/2014  Results for orders placed in visit on 04/27/14  POCT INFLUENZA A/B      Result Value Ref Range   Influenza A, POC Negative     Influenza B, POC Negative    POCT RAPID STREP A (OFFICE)      Result Value Ref Range   Rapid Strep A Screen Negative  Negative         Assessment & Plan:   1. Fever, unspecified fever cause   2. Viral pharyngitis    Motrin or tylenol OTC Force fluids Follow up prn  Mary-Margaret Daphine DeutscherMartin, FNP

## 2014-05-11 ENCOUNTER — Other Ambulatory Visit: Payer: Self-pay | Admitting: Neurology

## 2014-05-15 ENCOUNTER — Other Ambulatory Visit: Payer: Self-pay

## 2014-05-15 MED ORDER — LEVETIRACETAM 750 MG PO TABS: ORAL_TABLET | ORAL | Status: DC

## 2014-08-25 ENCOUNTER — Ambulatory Visit: Payer: Self-pay | Admitting: Neurology

## 2014-08-27 ENCOUNTER — Ambulatory Visit: Payer: Self-pay | Admitting: Neurology

## 2014-09-01 ENCOUNTER — Ambulatory Visit (INDEPENDENT_AMBULATORY_CARE_PROVIDER_SITE_OTHER): Payer: BLUE CROSS/BLUE SHIELD | Admitting: Neurology

## 2014-09-01 ENCOUNTER — Encounter: Payer: Self-pay | Admitting: Neurology

## 2014-09-01 VITALS — BP 104/65 | HR 78 | Ht 67.0 in | Wt 138.4 lb

## 2014-09-01 DIAGNOSIS — G40309 Generalized idiopathic epilepsy and epileptic syndromes, not intractable, without status epilepticus: Secondary | ICD-10-CM

## 2014-09-01 NOTE — Patient Instructions (Signed)

## 2014-09-01 NOTE — Progress Notes (Addendum)
Reason for visit: Seizures  Jeanette Gomez is an 34 y.o. female  History of present illness:  Jeanette Gomez is a 34 year old left-handed black female with epilepsy. Her last seizure was 12 or 13 years ago. The patient initially was on Depakote, but she has been switched to Keppra, and she is doing well with this. She tolerates the medication well. She has a history of a chronic pain syndrome, she is followed by Dr. Murray Hodgkins for this. The patient is taking 6-8 fifty milligram Ultram tablets a day. In the past, the patient indicates that lack of sleep was a significant activator for her seizures. The patient indicates that she may get an unusual sensation several moments prior to the onset of the generalized seizure event. Even on Ultram, the patient has not had any seizures, and she remains well controlled. She indicates that Ultram works well for her pain, she has been on several other medications previously without much benefit. She returns to this office for an evaluation. The patient has lost some weight since coming off of the Depakote.  Past Medical History  Diagnosis Date  . Seizures   . Neuromuscular disorder     myofaciasal pain  . PUD (peptic ulcer disease)     Past Surgical History  Procedure Laterality Date  . Appendectomy      Family History  Problem Relation Age of Onset  . Hyperlipidemia Mother   . Hypertension Mother   . Stroke Father   . Diabetes Maternal Grandmother   . Seizures Neg Hx     Social history:  reports that she has never smoked. She has never used smokeless tobacco. She reports that she does not drink alcohol or use illicit drugs.    Allergies  Allergen Reactions  . Penicillins     Medications:  Current Outpatient Prescriptions on File Prior to Visit  Medication Sig Dispense Refill  . fexofenadine (ALLEGRA) 180 MG tablet Take 180 mg by mouth daily.    Marland Kitchen levETIRAcetam (KEPPRA) 750 MG tablet TAKE 1 TABLET (750 MG TOTAL) BY MOUTH 2 (TWO) TIMES  DAILY. 180 tablet 1  . ranitidine (ZANTAC) 150 MG capsule Take 1 capsule (150 mg total) by mouth 2 (two) times daily. 60 capsule 0  . traMADol (ULTRAM) 50 MG tablet Take 100 mg by mouth every 4 (four) hours as needed for pain.    . meloxicam (MOBIC) 7.5 MG tablet Take 1 tablet (7.5 mg total) by mouth daily. (Patient not taking: Reported on 09/01/2014) 30 tablet 0   No current facility-administered medications on file prior to visit.    ROS:  Out of a complete 14 system review of symptoms, the patient complains only of the following symptoms, and all other reviewed systems are negative.  Fatigue Feeling cold Joint pain, achy muscles History of seizures Decreased appetite  Blood pressure 104/65, pulse 78, height  (1.702 m), weight 138 lb 6.4 oz (62.778 kg).  Physical Exam  General: The patient is alert and cooperative at the time of the examination.  Skin: No significant peripheral edema is noted.   Neurologic Exam  Mental status: The patient is oriented x 3.  Cranial nerves: Facial symmetry is present. Speech is normal, no aphasia or dysarthria is noted. Extraocular movements are full. Visual fields are full.  Motor: The patient has good strength in all 4 extremities.  Sensory examination: Soft touch sensation is symmetric on the face, arms, and legs.  Coordination: The patient has good finger-nose-finger and  heel-to-shin bilaterally.  Gait and station: The patient has a normal gait. Tandem gait is normal. Romberg is negative. No drift is seen.  Reflexes: Deep tendon reflexes are symmetric.   Assessment/Plan:  1. History of seizures, under good control  2. Chronic pain syndrome, neuromuscular pain  The patient is on relatively high doses of Ultram. The use of Ultram in someone with epilepsy is relatively contraindicated. The patient however, indicates that she has been on his medication for number of years, she and she has done well on the medication without any  seizure recurrence. I would use his medication with caution as Ultram can lower the seizure threshold. The patient will follow-up through this office in one year.  Marlan Palau. Keith Willis MD 09/01/2014 8:36 AM  [facility] [address] [place], [place] [id number]  Phone [phone] Fax [phone]

## 2014-09-24 ENCOUNTER — Ambulatory Visit: Payer: BC Managed Care – PPO | Admitting: Neurology

## 2014-09-25 ENCOUNTER — Ambulatory Visit: Payer: BC Managed Care – PPO | Admitting: Neurology

## 2015-01-17 ENCOUNTER — Other Ambulatory Visit: Payer: Self-pay | Admitting: Neurology

## 2015-03-02 ENCOUNTER — Telehealth: Payer: Self-pay | Admitting: *Deleted

## 2015-03-02 DIAGNOSIS — Z0289 Encounter for other administrative examinations: Secondary | ICD-10-CM

## 2015-03-02 NOTE — Telephone Encounter (Signed)
Form,DMV sent to [facility] and Dr Anne Hahn 03/02/15.

## 2015-03-08 ENCOUNTER — Telehealth: Payer: Self-pay | Admitting: *Deleted

## 2015-03-08 NOTE — Telephone Encounter (Signed)
Form completed, signed and returned to Donna. 

## 2015-03-08 NOTE — Telephone Encounter (Signed)
Form,DMV received, completed by Dr Anne Hahn and [facility] [employer] faxed, mailed patient a copy 03/08/15.

## 2015-06-11 ENCOUNTER — Encounter: Payer: Self-pay | Admitting: Family Medicine

## 2015-06-11 ENCOUNTER — Telehealth: Payer: Self-pay | Admitting: Neurology

## 2015-06-11 ENCOUNTER — Ambulatory Visit (INDEPENDENT_AMBULATORY_CARE_PROVIDER_SITE_OTHER): Payer: BLUE CROSS/BLUE SHIELD

## 2015-06-11 ENCOUNTER — Ambulatory Visit (INDEPENDENT_AMBULATORY_CARE_PROVIDER_SITE_OTHER): Payer: BLUE CROSS/BLUE SHIELD | Admitting: Family Medicine

## 2015-06-11 VITALS — BP 108/67 | HR 75 | Temp 98.1°F | Ht 67.0 in | Wt 143.8 lb

## 2015-06-11 DIAGNOSIS — M791 Myalgia: Secondary | ICD-10-CM

## 2015-06-11 DIAGNOSIS — M7918 Myalgia, other site: Secondary | ICD-10-CM

## 2015-06-11 DIAGNOSIS — M25522 Pain in left elbow: Secondary | ICD-10-CM | POA: Diagnosis not present

## 2015-06-11 DIAGNOSIS — R569 Unspecified convulsions: Secondary | ICD-10-CM

## 2015-06-11 MED ORDER — DICLOFENAC SODIUM 1 % TD GEL: 2.0000 g | Freq: Four times a day (QID) | TRANSDERMAL | Status: DC

## 2015-06-11 NOTE — Progress Notes (Signed)
   HPI  Patient presents today here with left elbow pain.  Patient has a history of myofascial pain syndrome with chronic left shoulder pain due to that and previous to that was in her right shoulder which improved after a steroid injection. Patient's when she's had 4-5 days of left elbow pain described as burning type pain extending from her lap lateral epicondyles down her forearm. She has pain with movement or palpation. Naproxen is helping some. She's been wearing a sling for 2-3 days. He has no injury of the elbow and cannot think of any inciting events except for raking last week.  She has no swelling, redness, fever, chills, sweats, difficulty breathing, or chest pain.  PMH: Smoking status noted ROS: Per HPI  Objective: BP 108/67 mmHg  Pulse 75  Temp(Src) 98.1 F (36.7 C) (Oral)  Ht 5\' 7"  (1.702 m)  Wt 143 lb 12.8 oz (65.227 kg)  BMI 22.52 kg/m2 Gen: NAD, alert, cooperative with exam HEENT: NCAT Ext: No edema, warm Neuro: Alert and oriented, No gross deficits Musculoskeletal Left elbow with tenderness to palpation around the lateral epicondyles  Assessment and plan:  # Left elbow pain, medial condyle mellitus Discussed at length with patient, limited with NSAIDs and steroids as she states that she has an ulcer. I've encouraged her to stay away from oral NSAIDs. Try Voltaren gel, continue chronic tramadol Gentle stretching, rest I have given her the golfers elbow handout from sports medicine advisor.     Orders Placed This Encounter  Procedures  . DG Elbow 2 Views Left    Standing Status: Future     Number of Occurrences: 1     Standing Expiration Date: 08/10/2016    Order Specific Question:  Reason for Exam (SYMPTOM  OR DIAGNOSIS REQUIRED)    Answer:  left elbow pain    Order Specific Question:  Is the patient pregnant?    Answer:  No    Order Specific Question:  Preferred imaging location?    Answer:  Internal    No orders of the defined types were  placed in this encounter.    Murtis SinkSam Vanesha Athens, MD [facility] 06/11/2015, 9:41 AM

## 2015-06-11 NOTE — Patient Instructions (Addendum)
Great to meet you!  Try voltaren gel Take naproxen twice daily for 5 days Start the stretches and trying to move the elbow like we talked about.   Come back if you get worse or dont get better as expected.   Medial Epicondylitis  Medial epicondylitis involves inflammation and pain around the inner (medial) portion of the elbow. This pain is caused by inflammation of the tendons in the forearm that flex (bring down) the wrist. Medial epicondylitis is also called golfer's elbow, because it is common among golfers. However, it may occur in any individual who flexes the wrist regularly. If medial epicondylitis is left untreated, it may become a chronic problem. SYMPTOMS   Pain, tenderness, or inflammation over the inner (medial) side of the elbow.  Pain or weakness with gripping activities.  Pain that increases with wrist twisting motions (using a screwdriver, playing golf, bowling). CAUSES  Medial epicondylitis is caused by inflammation of the tendons that flex the wrist. Causes of injury may include:  Chronic, repetitive stress and strain to the tendons that run from the wrist and forearm to the elbow.  Sudden strain on the forearm, including wrist snap when serving balls with racquet sports, or throwing a baseball. RISK INCREASES WITH:  Sports or occupations that require repetitive and/or strenuous forearm and wrist movements (pitching a baseball, golfing, carpentry).  Poor wrist and forearm strength and flexibility.  Failure to warm up properly before activity.  Resuming activity before healing, rehabilitation, and conditioning are complete. PREVENTION   Warm up and stretch properly before activity.  Maintain physical fitness:  Strength, flexibility, and endurance.  Cardiovascular fitness.  Wear and use properly fitted equipment.  Learn and use proper technique and have a coach correct improper technique.  Wear a tennis elbow (counterforce) brace. PROGNOSIS  The course  of this condition depends on the degree of the injury. If treated properly, acute cases (symptoms lasting less than 4 weeks) are often resolved in 2 to 6 weeks. Chronic (longer lasting cases) often resolve in 3 to 6 months, but may require physical therapy. RELATED COMPLICATIONS   Frequently recurring symptoms, resulting in a chronic problem. Properly treating the problem the first time decreases frequency of recurrence.  Chronic inflammation, scarring, and partial tendon tear, requiring surgery.  Delayed healing or resolution of symptoms. TREATMENT  Treatment first involves the use of ice and medicine, to reduce pain and inflammation. Strengthening and stretching exercises may reduce discomfort, if performed regularly. These exercises may be performed at home, if the condition is an acute injury. Chronic cases may require a referral to a physical therapist for evaluation and treatment. Your caregiver may advise a corticosteroid injection to help reduce inflammation. Rarely, surgery is needed. MEDICATION  If pain medicine is needed, nonsteroidal anti-inflammatory medicines (aspirin and ibuprofen), or other minor pain relievers (acetaminophen), are often advised.  Do not take pain medicine for 7 days before surgery.  Prescription pain relievers may be given, if your caregiver thinks they are needed. Use only as directed and only as much as you need.  Corticosteroid injections may be recommended. These injections should be reserved only for the most severe cases, because they can only be given a certain number of times. HEAT AND COLD  Cold treatment (icing) should be applied for 10 to 15 minutes every 2 to 3 hours for inflammation and pain, and immediately after activity that aggravates your symptoms. Use ice packs or an ice massage.  Heat treatment may be used before performing stretching and  strengthening activities prescribed by your caregiver, physical therapist, or athletic trainer. Use a  heat pack or a warm water soak. SEEK MEDICAL CARE IF: Symptoms get worse or do not improve in 2 weeks, despite treatment.

## 2015-06-11 NOTE — Telephone Encounter (Signed)
Patient called to see about stopping levETIRAcetam (KEPPRA) 750 MG tablet, pt is aware Dr. Anne HahnWillis is out of the office today, will be back on Monday.

## 2015-06-12 ENCOUNTER — Telehealth: Payer: Self-pay | Admitting: Nurse Practitioner

## 2015-06-12 ENCOUNTER — Telehealth: Payer: Self-pay | Admitting: Family Medicine

## 2015-06-12 LAB — CBC
HEMATOCRIT: 31.6 % — AB (ref 34.0–46.6)
HEMOGLOBIN: 10.8 g/dL — AB (ref 11.1–15.9)
MCH: 28.5 pg (ref 26.6–33.0)
MCHC: 34.2 g/dL (ref 31.5–35.7)
MCV: 83 fL (ref 79–97)
PLATELETS: 221 10*3/uL (ref 150–379)
RBC: 3.79 x10E6/uL (ref 3.77–5.28)
RDW: 14.6 % (ref 12.3–15.4)
WBC: 6.1 10*3/uL (ref 3.4–10.8)

## 2015-06-12 LAB — CMP14+EGFR
A/G RATIO: 1.5 (ref 1.1–2.5)
ALT: 9 IU/L (ref 0–32)
AST: 17 IU/L (ref 0–40)
Albumin: 4.3 g/dL (ref 3.5–5.5)
Alkaline Phosphatase: 35 IU/L — ABNORMAL LOW (ref 39–117)
BILIRUBIN TOTAL: 0.9 mg/dL (ref 0.0–1.2)
BUN/Creatinine Ratio: 17 (ref 8–20)
BUN: 11 mg/dL (ref 6–20)
CALCIUM: 9.3 mg/dL (ref 8.7–10.2)
CO2: 24 mmol/L (ref 18–29)
CREATININE: 0.63 mg/dL (ref 0.57–1.00)
Chloride: 100 mmol/L (ref 97–106)
GFR calc Af Amer: 135 mL/min/{1.73_m2} (ref >59)
GFR, EST NON AFRICAN AMERICAN: 117 mL/min/{1.73_m2} (ref >59)
Globulin, Total: 2.9 g/dL (ref 1.5–4.5)
Glucose: 68 mg/dL (ref 65–99)
Potassium: 3.7 mmol/L (ref 3.5–5.2)
Sodium: 139 mmol/L (ref 136–144)
Total Protein: 7.2 g/dL (ref 6.0–8.5)

## 2015-06-12 NOTE — Telephone Encounter (Addendum)
Prescribed voltaren gel for elbow pain. Can't take NSAIDs due to history of stomach ulcer.  Voltaren needs a prior authorization.  Tried to obtain prior authorization but [employer] [place] office not open on weekend.  Patient requested a joint steroid injection at her visit. She has had this before and it helped. She had requested an appt with a provider that would be able to do the injection that day and is upset that she wasn't able to get that done at the appt.  She has an appt with Dr Dettinger on Monday for injection and also scheduled one with ortho. She hasn't decided which she'll keep.  Spoke with Dr Oswaldo DoneVincent who is working [facility] today. Suggested Aspercreme and anecream since she isn't able to get diclofenac.  Suggested this to patient and she will give it a try.

## 2015-06-14 ENCOUNTER — Encounter: Payer: Self-pay | Admitting: Family Medicine

## 2015-06-14 ENCOUNTER — Ambulatory Visit (INDEPENDENT_AMBULATORY_CARE_PROVIDER_SITE_OTHER): Payer: BLUE CROSS/BLUE SHIELD | Admitting: Family Medicine

## 2015-06-14 VITALS — BP 114/73 | HR 78 | Temp 97.8°F | Ht 67.0 in | Wt 142.1 lb

## 2015-06-14 DIAGNOSIS — M7712 Lateral epicondylitis, left elbow: Secondary | ICD-10-CM

## 2015-06-14 DIAGNOSIS — D509 Iron deficiency anemia, unspecified: Secondary | ICD-10-CM | POA: Diagnosis not present

## 2015-06-14 MED ORDER — METHYLPREDNISOLONE ACETATE 80 MG/ML IJ SUSP
40.0000 mg | Freq: Once | INTRAMUSCULAR | Status: AC
  Administered 2015-06-14: 40 mg via INTRALESIONAL

## 2015-06-14 NOTE — Progress Notes (Signed)
BP 114/73 mmHg  Pulse 78  Temp(Src) 97.8 F (36.6 C) (Oral)  Ht '5\' 7"'  (1.702 m)  Wt 142 lb 1.6 oz (64.456 kg)  BMI 22.25 kg/m2  LMP 06/07/2015 (Approximate)   Subjective:    Patient ID: Jeanette Gomez, female    DOB: Jan 17, 1981, 34 y.o.   MRN: [id number]  HPI: Jeanette Gomez is a 34 y.o. female presenting on 06/14/2015 for Left elbow pain   HPI Tennis elbow Patient has been having pain on the medial epicondyle of her left elbow for the past 5 days because of raking outside. She has tried some oral anti-inflammatories and was given a prescription for topical Voltaren but didn't get it because of the prior authorization yet. She is coming in today because she would like an injection of that elbow. She denies any fevers or chills or skin changes overlying the area. She gets myofascial pain and would like an injection similar to the one she is having her shoulder previously. She has been keeping her arm up in a sling-like position for the past 3 or 4 days. And she says she cannot fully extend it because of the pain.  Anemia Patient was found to be mildly anemic at her last appointment and will add a ferritin on because it was microcytic as well.  Relevant past medical, surgical, family and social history reviewed and updated as indicated. Interim medical history since our last visit reviewed. Allergies and medications reviewed and updated.  Review of Systems  Constitutional: Negative for fever and chills.  HENT: Negative for congestion, ear discharge and ear pain.   Eyes: Negative for redness and visual disturbance.  Respiratory: Negative for chest tightness and shortness of breath.   Cardiovascular: Negative for chest pain and leg swelling.  Genitourinary: Negative for dysuria and difficulty urinating.  Musculoskeletal: Positive for myalgias and arthralgias. Negative for back pain, joint swelling and gait problem.  Skin: Negative for rash.  Neurological: Negative for  light-headedness and headaches.  Psychiatric/Behavioral: Negative for behavioral problems and agitation.  All other systems reviewed and are negative.   Per HPI unless specifically indicated above     Medication List       This list is accurate as of: 06/14/15 10:53 AM.  Always use your most recent med list.               diclofenac sodium 1 % Gel  Commonly known as:  VOLTAREN  Apply 2 g topically 4 (four) times daily.     fexofenadine 180 MG tablet  Commonly known as:  ALLEGRA  Take 180 mg by mouth daily.     levETIRAcetam 750 MG tablet  Commonly known as:  KEPPRA  TAKE 1 TABLET (750 MG TOTAL) BY MOUTH 2 (TWO) TIMES DAILY.     traMADol 50 MG tablet  Commonly known as:  ULTRAM  Take 100 mg by mouth every 4 (four) hours as needed for pain.           Objective:    BP 114/73 mmHg  Pulse 78  Temp(Src) 97.8 F (36.6 C) (Oral)  Ht '5\' 7"'  (1.702 m)  Wt 142 lb 1.6 oz (64.456 kg)  BMI 22.25 kg/m2  LMP 06/07/2015 (Approximate)  Wt Readings from Last 3 Encounters:  06/14/15 142 lb 1.6 oz (64.456 kg)  06/11/15 143 lb 12.8 oz (65.227 kg)  09/01/14 138 lb 6.4 oz (62.778 kg)    Physical Exam  Constitutional: She is oriented to person, place, and time.  She appears well-developed and well-nourished. No distress.  Eyes: Conjunctivae and EOM are normal. Pupils are equal, round, and reactive to light.  Cardiovascular: Normal rate, regular rhythm, normal heart sounds and intact distal pulses.   No murmur heard. Pulmonary/Chest: Effort normal and breath sounds normal. No respiratory distress. She has no wheezes.  Musculoskeletal: She exhibits no edema.       Left elbow: She exhibits decreased range of motion (patient will not let it fully be extended because of pain so difficult to assess whether range of motion is limited or not). She exhibits no swelling, no effusion, no deformity and no laceration. Tenderness found. Medial epicondyle tenderness noted. No radial head, no lateral  epicondyle and no olecranon process tenderness noted.  Neurological: She is alert and oriented to person, place, and time. Coordination normal.  Skin: Skin is warm and dry. No rash noted. She is not diaphoretic.  Psychiatric: She has a normal mood and affect. Her behavior is normal.  Nursing note and vitals reviewed.   Results for orders placed or performed in visit on 06/11/15  CMP14+EGFR  Result Value Ref Range   Glucose 68 65 - 99 mg/dL   BUN 11 6 - 20 mg/dL   Creatinine, Ser 0.63 0.57 - 1.00 mg/dL   GFR calc non Af Amer 117 >59 mL/min/1.73   GFR calc Af Amer 135 >59 mL/min/1.73   BUN/Creatinine Ratio 17 8 - 20   Sodium 139 136 - 144 mmol/L   Potassium 3.7 3.5 - 5.2 mmol/L   Chloride 100 97 - 106 mmol/L   CO2 24 18 - 29 mmol/L   Calcium 9.3 8.7 - 10.2 mg/dL   Total Protein 7.2 6.0 - 8.5 g/dL   Albumin 4.3 3.5 - 5.5 g/dL   Globulin, Total 2.9 1.5 - 4.5 g/dL   Albumin/Globulin Ratio 1.5 1.1 - 2.5   Bilirubin Total 0.9 0.0 - 1.2 mg/dL   Alkaline Phosphatase 35 (L) 39 - 117 IU/L   AST 17 0 - 40 IU/L   ALT 9 0 - 32 IU/L  CBC  Result Value Ref Range   WBC 6.1 3.4 - 10.8 x10E3/uL   RBC 3.79 3.77 - 5.28 x10E6/uL   Hemoglobin 10.8 (L) 11.1 - 15.9 g/dL   Hematocrit 31.6 (L) 34.0 - 46.6 %   MCV 83 79 - 97 fL   MCH 28.5 26.6 - 33.0 pg   MCHC 34.2 31.5 - 35.7 g/dL   RDW 14.6 12.3 - 15.4 %   Platelets 221 150 - 379 x10E3/uL   Medial epicondyles injection: Risk factors of bleeding and infection discussed with patient and patient is agreeable towards injection. Patient prepped with Betadine. Injected at trigger point or most painful point taking care to avoid the nerve. Injected 40 mg of Depo-Medrol and 1 mL of 2% lidocaine. Patient tolerated procedure well and no side effects from noted. Minimal to no bleeding. Simple bandage applied after.     Assessment & Plan:   Problem List Items Addressed This Visit    None    Visit Diagnoses    Tennis elbow syndrome, left    -  Primary     Relevant Medications    methylPREDNISolone acetate (DEPO-MEDROL) injection 40 mg    Anemia, iron deficiency        Relevant Orders    Ferritin        Follow up plan: Return if symptoms worsen or fail to improve.  Counseling provided for all of the vaccine components  No orders of the defined types were placed in this encounter.    Caryl Pina, MD [facility] 06/14/2015, 10:53 AM

## 2015-06-14 NOTE — Telephone Encounter (Signed)
I called the patient. The patient has not had a seizure in over 15 years. She is motivated towards coming off the medication to see if she can do without it. I will check an EEG study, but this is unremarkable, we may consider a slow taper as long as the patient is willing to accept the risk of the taper.

## 2015-06-14 NOTE — Telephone Encounter (Signed)
I called the patient. She states it has been 15 years since she last had a seizure and if she could come off of the Keppra, she would really like to. She feels like she is taking too many medications and doesn't want to continue taking something if it isn't necessary. I advised that I would ask Dr. Anne HahnWillis and call her back.

## 2015-06-15 LAB — FERRITIN: FERRITIN: 31 ng/mL (ref 15–150)

## 2015-06-17 ENCOUNTER — Telehealth: Payer: Self-pay

## 2015-06-17 NOTE — Telephone Encounter (Signed)
Insurance prior authorized Diclofenac 1% gel

## 2015-06-18 NOTE — Telephone Encounter (Signed)
Reviewed test results with pt

## 2015-06-30 ENCOUNTER — Other Ambulatory Visit: Payer: Self-pay | Admitting: Neurology

## 2015-07-19 ENCOUNTER — Encounter: Payer: Self-pay | Admitting: Family Medicine

## 2015-07-19 ENCOUNTER — Ambulatory Visit (INDEPENDENT_AMBULATORY_CARE_PROVIDER_SITE_OTHER): Payer: BLUE CROSS/BLUE SHIELD | Admitting: Family Medicine

## 2015-07-19 VITALS — BP 110/64 | HR 83 | Temp 98.0°F | Ht 67.0 in | Wt 143.4 lb

## 2015-07-19 DIAGNOSIS — G43809 Other migraine, not intractable, without status migrainosus: Secondary | ICD-10-CM

## 2015-07-19 DIAGNOSIS — G4489 Other headache syndrome: Secondary | ICD-10-CM

## 2015-07-19 DIAGNOSIS — R519 Headache, unspecified: Secondary | ICD-10-CM | POA: Insufficient documentation

## 2015-07-19 DIAGNOSIS — R51 Headache: Secondary | ICD-10-CM

## 2015-07-19 MED ORDER — KETOROLAC TROMETHAMINE 60 MG/2ML IM SOLN
60.0000 mg | Freq: Once | INTRAMUSCULAR | Status: AC
  Administered 2015-07-19: 60 mg via INTRAMUSCULAR

## 2015-07-19 NOTE — Progress Notes (Signed)
   Subjective:    Patient ID: Jeanette Beltoniffany E Gomez, female    DOB: 02/26/1981, 35 y.o.   MRN: [id number]  HPI 35 year old female with right sided headache with nausea. She has a history of infrequent migraine. When asked about provocative symptoms she mentions sinus infections. She also has an ongoing sinus infection now that seems to be affecting more other right maxillary frontal sinus area. She does take antihistamine for that. I suggested that she hold antihistamine to keep sinuses from becoming more congested  Patient Active Problem List   Diagnosis Date Noted  . Myofascial pain syndrome 12/10/2013  . Epilepsy (HCC) 02/28/2013   Outpatient Encounter Prescriptions as of 07/19/2015  Medication Sig  . fexofenadine (ALLEGRA) 180 MG tablet Take 180 mg by mouth daily.  Marland Kitchen. levETIRAcetam (KEPPRA) 750 MG tablet TAKE 1 TABLET (750 MG TOTAL) BY MOUTH 2 (TWO) TIMES DAILY.  . traMADol (ULTRAM) 50 MG tablet Take 100 mg by mouth every 4 (four) hours as needed for pain.  . [DISCONTINUED] diclofenac sodium (VOLTAREN) 1 % GEL Apply 2 g topically 4 (four) times daily. (Patient not taking: Reported on 06/14/2015)   No facility-administered encounter medications on file as of 07/19/2015.      Review of Systems  Constitutional: Negative.   HENT: Positive for congestion.   Respiratory: Negative.   Cardiovascular: Negative.   Neurological: Positive for headaches.  Psychiatric/Behavioral: Negative.        Objective:   Physical Exam  Constitutional: She is oriented to person, place, and time. She appears well-developed and well-nourished.  HENT:  Head: Normocephalic.  Left maxillary sinus tenderness to percussion  Eyes: Pupils are equal, round, and reactive to light.  Pulmonary/Chest: Effort normal and breath sounds normal.  Neurological: She is alert and oriented to person, place, and time. She has normal reflexes. No cranial nerve deficit.          Assessment & Plan:  1. Other type of migraine Will  treat acutely with Toradol injection. Rx for Maxalt to take at next migraine - ketorolac (TORADOL) injection 60 mg; Inject 2 mLs (60 mg total) into the muscle once.  Frederica KusterStephen M Miller MD

## 2015-07-22 ENCOUNTER — Telehealth: Payer: Self-pay | Admitting: Family Medicine

## 2015-07-22 MED ORDER — RIZATRIPTAN BENZOATE 10 MG PO TABS: 10.0000 mg | ORAL_TABLET | ORAL | Status: DC | PRN

## 2015-07-22 NOTE — Telephone Encounter (Signed)
Pt aware Maxalt 10 mg sent to pharmacy. Instructed per Dr. Oswaldo DoneVincent to take 1/2 tablet at first migraine see how that does then she may take a whole tablet if need to next time.

## 2015-07-29 ENCOUNTER — Telehealth: Payer: Self-pay | Admitting: Neurology

## 2015-07-29 ENCOUNTER — Ambulatory Visit (INDEPENDENT_AMBULATORY_CARE_PROVIDER_SITE_OTHER): Payer: BLUE CROSS/BLUE SHIELD | Admitting: Neurology

## 2015-07-29 DIAGNOSIS — R569 Unspecified convulsions: Secondary | ICD-10-CM | POA: Diagnosis not present

## 2015-07-29 NOTE — Telephone Encounter (Signed)
I called patient. The EEG was normal. The patient is motivated towards coming off of the seizure medication. We will go off by one half of a 750 mg tablet every 2 weeks until she is off the drug. She is to contact our office if any issues arise during the taper.

## 2015-07-29 NOTE — Procedures (Signed)
    History:  Jeanette Gomez is a 35 year old patient with a history of seizures, no seizures in over 15 years. The patient is motivated towards coming off of her anticonvulsant medications. She is having a repeat EEG for this reason.  This is a routine EEG. No skull defects are noted. Medications include Allegra, Keppra, Maxalt, and Ultram.   EEG classification: Normal awake  Description of the recording: The background rhythms of this recording consists of a fairly well modulated medium amplitude alpha rhythm of 9 Hz that is reactive to eye opening and closure. As the record progresses, the patient appears to remain in the waking state throughout the recording. Photic stimulation was performed, resulting in a bilateral and symmetric photic driving response. Hyperventilation was also performed, resulting in a minimal buildup of the background rhythm activities without significant slowing seen. At no time during the recording does there appear to be evidence of spike or spike wave discharges or evidence of focal slowing. EKG monitor shows no evidence of cardiac rhythm abnormalities with a heart rate of 72.  Impression: This is a normal EEG recording in the waking state. No evidence of ictal or interictal discharges are seen.

## 2015-08-13 IMAGING — CR DG HIP (WITH OR WITHOUT PELVIS) 2-3V*L*
2 series · 2 of 2 positions shown · non-contrast
Comparison: None

CLINICAL DATA: Left hip pain, injury dancing

EXAM:
LEFT HIP - COMPLETE 2+ VIEW

[view not recorded (1 of 2)]
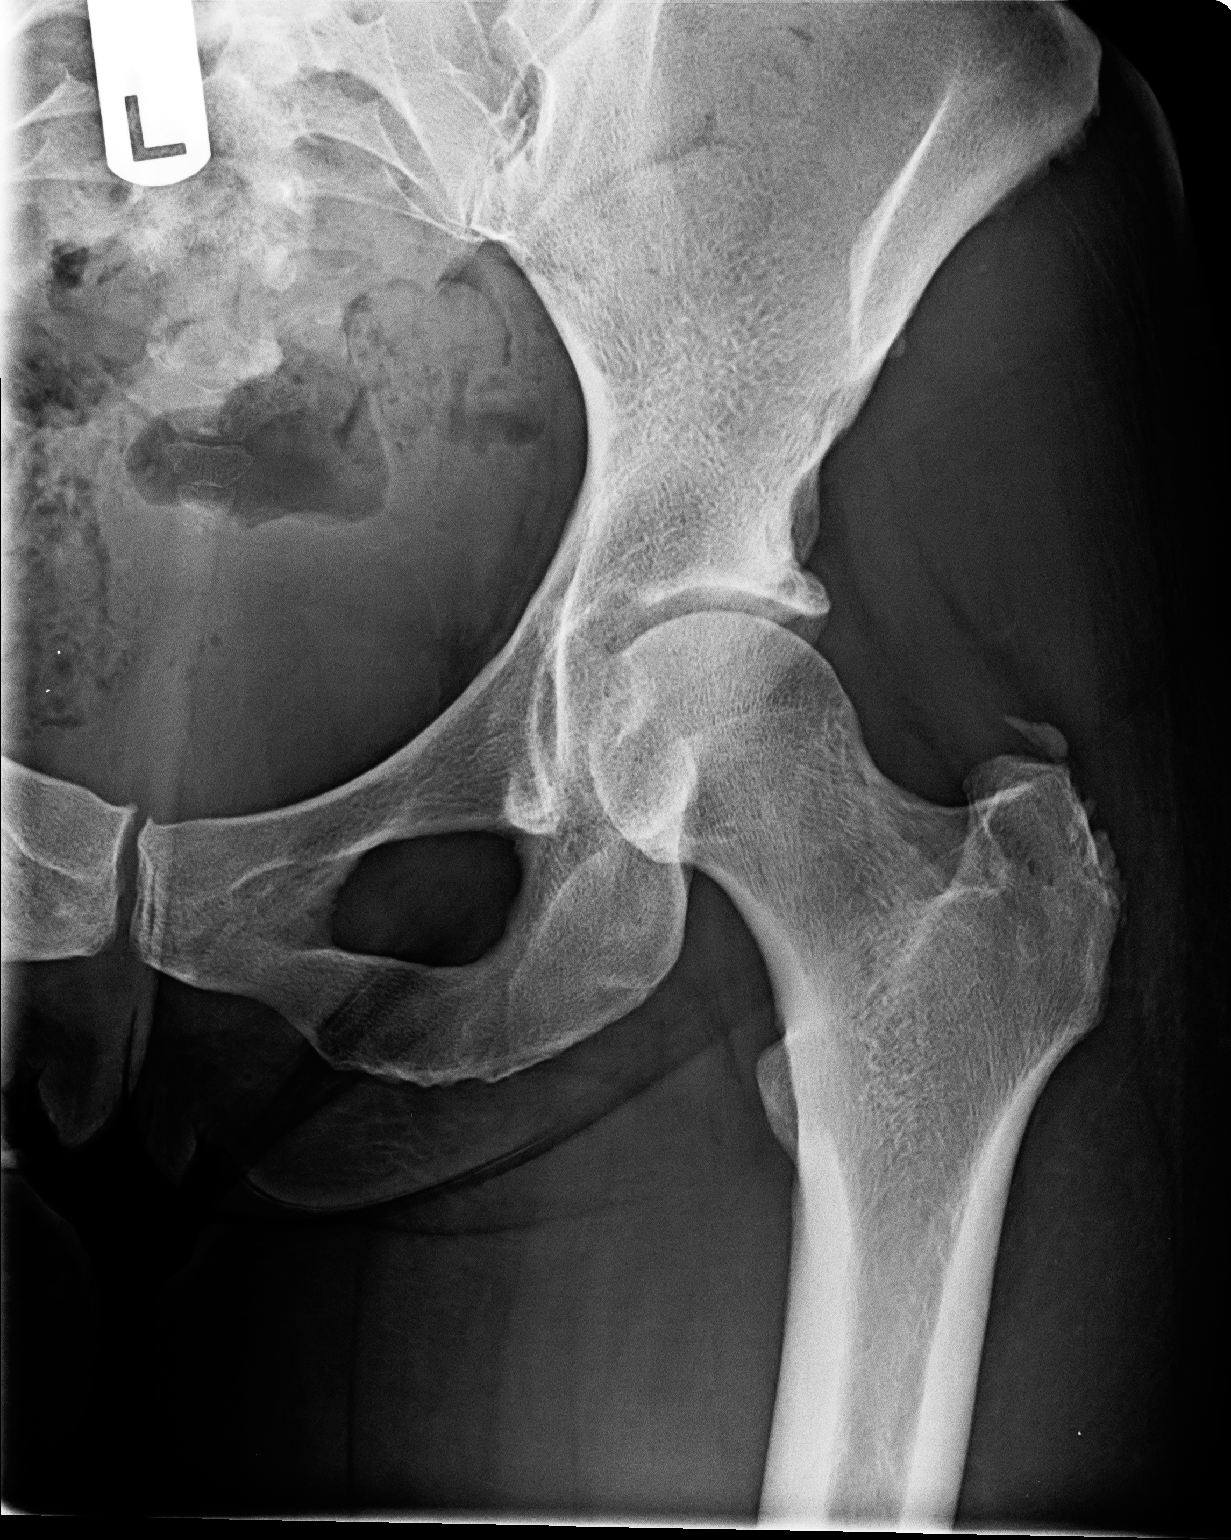

[view not recorded (2 of 2)]
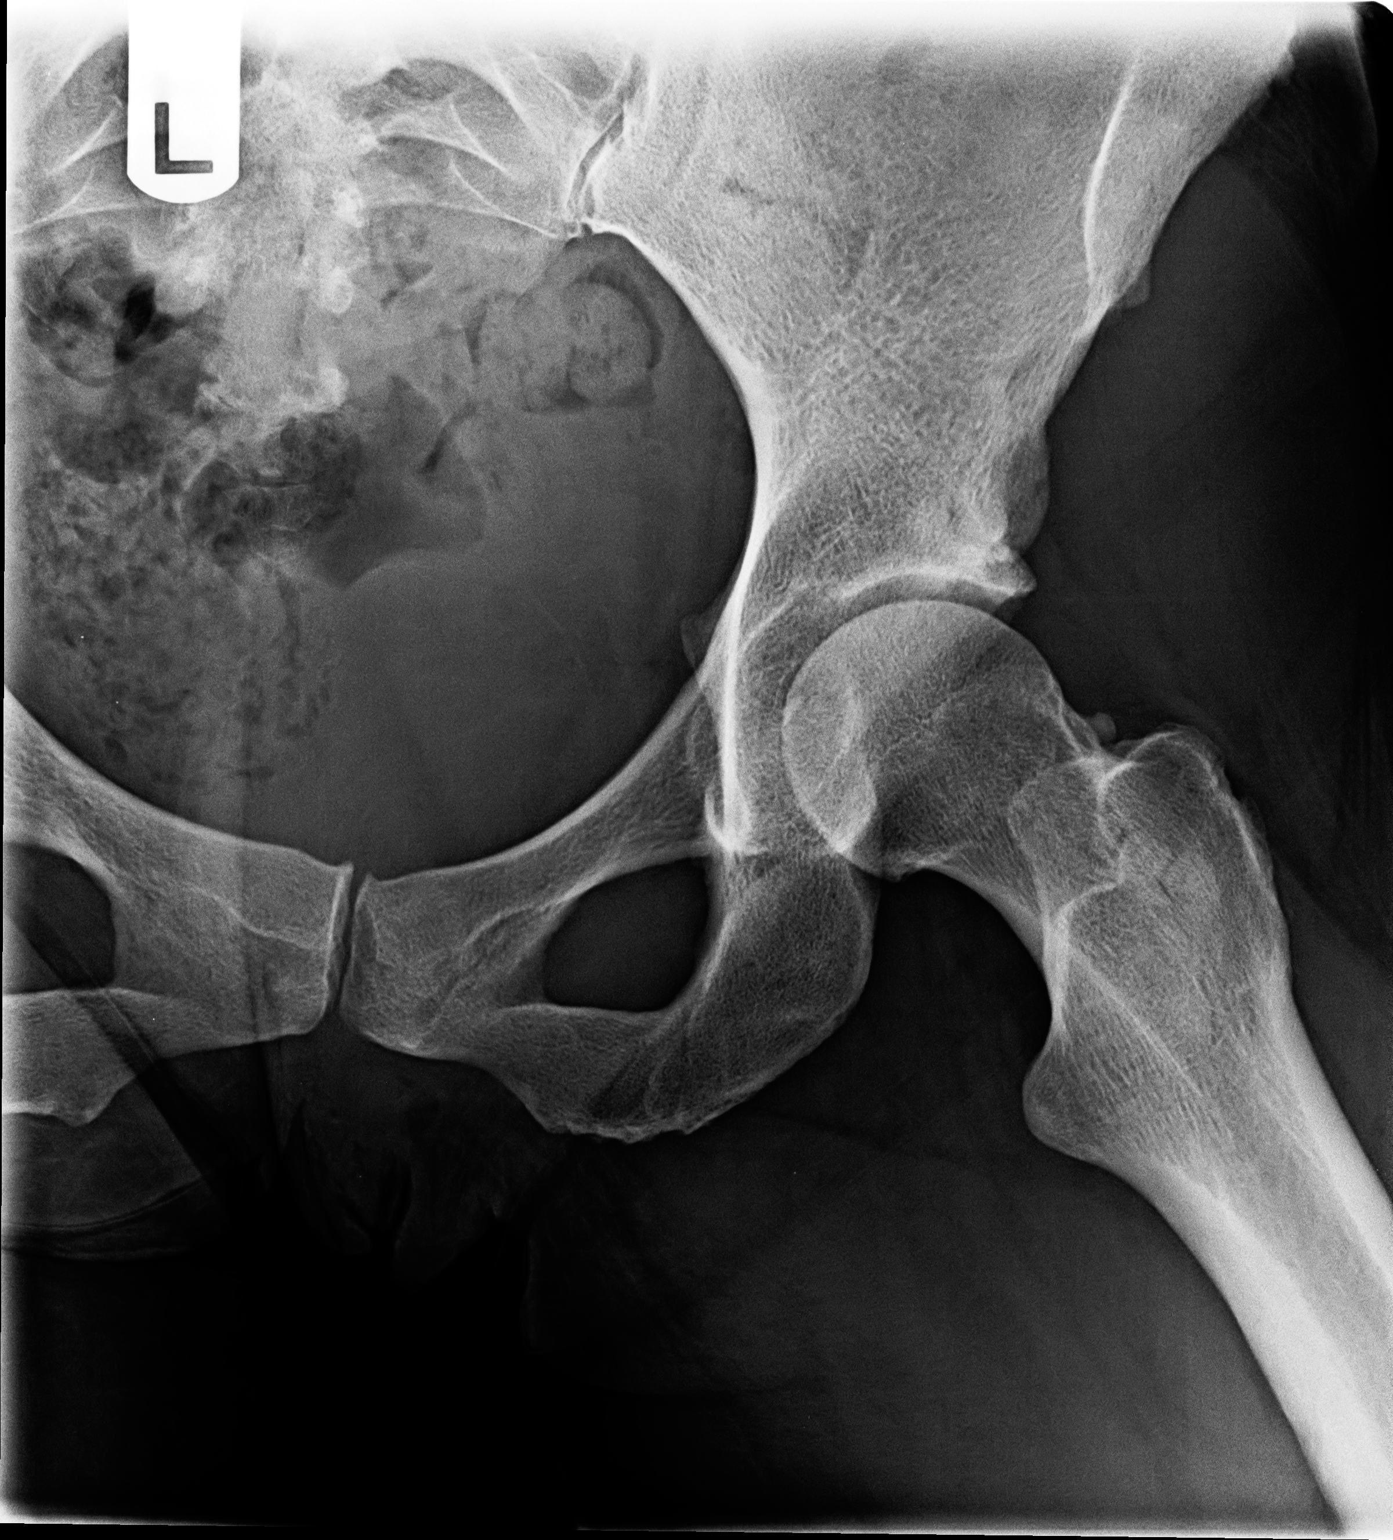

[2 of 2 positions shown; findings below may reference images not displayed]

FINDINGS: Osseous mineralization normal.

Visualized left pelvis intact.

Hip joint space preserved.

Bony density identified adjacent to the greater tuberosity, appears
corticated without visualized donor site.

This likely represents the sequela of a remote injury.

No definite acute fracture, dislocation or bone destruction.
IMPRESSION: Ossific density adjacent to the tip of the greater trochanter likely
representing sequela of a remote injury.

No definite acute fracture or dislocation identified.

If patient has persistent symptoms consider MR imaging.

## 2015-09-02 ENCOUNTER — Ambulatory Visit: Payer: BLUE CROSS/BLUE SHIELD | Admitting: Adult Health

## 2016-01-27 ENCOUNTER — Other Ambulatory Visit: Payer: Self-pay | Admitting: Orthopedic Surgery

## 2016-01-27 DIAGNOSIS — M7542 Impingement syndrome of left shoulder: Secondary | ICD-10-CM

## 2016-02-03 ENCOUNTER — Other Ambulatory Visit: Payer: Self-pay | Admitting: Orthopedic Surgery

## 2016-02-03 ENCOUNTER — Ambulatory Visit
Admission: RE | Admit: 2016-02-03 | Discharge: 2016-02-03 | Disposition: A | Discharge status: Home / Self Care | Payer: BLUE CROSS/BLUE SHIELD | Source: Ambulatory Visit | Attending: Orthopedic Surgery | Admitting: Orthopedic Surgery

## 2016-02-03 DIAGNOSIS — M7542 Impingement syndrome of left shoulder: Secondary | ICD-10-CM

## 2016-02-03 MED ORDER — IOPAMIDOL (ISOVUE-M 300) INJECTION 61%
1.0000 mL | Freq: Once | INTRAMUSCULAR | Status: AC | PRN
Start: 2016-02-03 — End: 2016-02-03
  Administered 2016-02-03: 1 mL via EPIDURAL

## 2016-02-03 MED ORDER — TRIAMCINOLONE ACETONIDE 40 MG/ML IJ SUSP (RADIOLOGY)
60.0000 mg | Freq: Once | INTRAMUSCULAR | Status: AC
  Administered 2016-02-03: 60 mg via EPIDURAL

## 2016-02-03 NOTE — Discharge Instructions (Signed)

## 2016-02-10 ENCOUNTER — Encounter: Payer: Self-pay | Admitting: Pediatrics

## 2016-02-10 ENCOUNTER — Ambulatory Visit (INDEPENDENT_AMBULATORY_CARE_PROVIDER_SITE_OTHER): Payer: BLUE CROSS/BLUE SHIELD | Admitting: Pediatrics

## 2016-02-10 ENCOUNTER — Telehealth: Payer: Self-pay

## 2016-02-10 VITALS — BP 112/71 | HR 66 | Temp 97.8°F | Ht 67.0 in | Wt 143.0 lb

## 2016-02-10 DIAGNOSIS — L309 Dermatitis, unspecified: Secondary | ICD-10-CM

## 2016-02-10 MED ORDER — TRIAMCINOLONE ACETONIDE 0.025 % EX CREA: TOPICAL_CREAM | Freq: Two times a day (BID) | CUTANEOUS | Status: AC

## 2016-02-10 NOTE — Telephone Encounter (Signed)
Patient left a message on our scheduler's voice mail that her legs and feet "are broken out" after her CESI here 02/03/16.  I left her a message on her cell phone that her symptoms are not related to the steroid injection she received that day.  I suggested she try a topical NSAID cream and/or Benadryl or to call her PCP for further assistance.  I left the direct number for the nurses' desk if she wanted to call and discuss this in more detail.  jkl

## 2016-02-10 NOTE — Patient Instructions (Signed)
Take benadryl as needed for itching Can use triamcinolone twice a day on itchy spots

## 2016-02-10 NOTE — Progress Notes (Signed)
    Subjective:    Patient ID: Lanaia Breyette Iyer, female    DOB: 08-17-1980, 35 y.o.   MRN: [id number]  CC: Rash (Legs and hands)   HPI: Aolanis E Willems is a 35 y.o. female presenting for Rash (Legs and hands)  Noticed it 2 days ago Several fine scattered red papules over hands Several more 1-46mm papules over LE Has been outside recently in the evening Says she may have been bitten by bugs No other new exposures Some itching   Depression screen [facility] 2/9 02/10/2016 06/14/2015  Decreased Interest 1 0  Down, Depressed, Hopeless 1 0  PHQ - 2 Score 2 0  Altered sleeping 3 -  Tired, decreased energy 3 -  Change in appetite 1 -  Feeling bad or failure about yourself  1 -  Trouble concentrating 1 -  Moving slowly or fidgety/restless 0 -  Suicidal thoughts 0 -  PHQ-9 Score 11 -  Difficult doing work/chores Somewhat difficult -     Relevant past medical, surgical, family and social history reviewed and updated. Interim medical history since our last visit reviewed. Allergies and medications reviewed and updated.  History  Smoking Status  . Never Smoker  Smokeless Tobacco  . Never Used    ROS: Per HPI      Objective:    BP 112/71   Pulse 66   Temp 97.8 F (36.6 C) (Oral)   Ht 5\' 7"  (1.702 m)   Wt 143 lb (64.9 kg)   BMI 22.40 kg/m   Wt Readings from Last 3 Encounters:  02/10/16 143 lb (64.9 kg)  07/19/15 143 lb 6.4 oz (65 kg)  06/14/15 142 lb 1.6 oz (64.5 kg)     Gen: NAD, alert, cooperative with exam, NCAT EYES: EOMI, no scleral injection or icterus CV: WWP Resp:  normal WOB Neuro: Alert and oriented, strength equal b/l UE and LE, coordination grossly normal Skin: a few scattered red raised papules over hands b/l, legs      Assessment & Plan:    Feyza was seen today for isolated red papules, possibly bug bites vs other contact dermatitis. Let me know if does not improve.  Diagnoses and all orders for this visit:  Dermatitis -     triamcinolone  (KENALOG) 0.025 % cream; Apply topically 2 (two) times daily.     Follow up plan: prn  Rex Kras, MD [facility] 02/10/2016, 6:54 PM

## 2016-02-10 NOTE — Telephone Encounter (Signed)
Jeanette Gomez called me back after listening to my message.  She states she's already made an appointment with her PCP to evaluate her legs and feet.  jkl

## 2016-02-16 ENCOUNTER — Other Ambulatory Visit: Payer: Self-pay | Admitting: Family Medicine

## 2016-03-21 ENCOUNTER — Ambulatory Visit (INDEPENDENT_AMBULATORY_CARE_PROVIDER_SITE_OTHER): Payer: BLUE CROSS/BLUE SHIELD | Admitting: Family

## 2016-03-21 ENCOUNTER — Encounter: Payer: Self-pay | Admitting: Family

## 2016-03-21 VITALS — BP 109/69 | HR 72 | Temp 98.7°F | Ht 67.0 in | Wt 146.2 lb

## 2016-03-21 DIAGNOSIS — M25512 Pain in left shoulder: Secondary | ICD-10-CM | POA: Diagnosis not present

## 2016-03-21 DIAGNOSIS — G43809 Other migraine, not intractable, without status migrainosus: Secondary | ICD-10-CM | POA: Diagnosis not present

## 2016-03-21 MED ORDER — KETOROLAC TROMETHAMINE 60 MG/2ML IM SOLN
60.0000 mg | Freq: Once | INTRAMUSCULAR | Status: AC
  Administered 2016-03-21: 60 mg via INTRAMUSCULAR

## 2016-03-21 NOTE — Progress Notes (Signed)
   Subjective:    Patient ID: Jeanette Gomez, female    DOB: 11/03/1980, 35 y.o.   MRN: [id number]  Headache   Associated symptoms include sinus pressure and tingling. Pertinent negatives include no coughing, ear pain or sore throat.  Shoulder Pain   The pain is present in the left shoulder. This is a recurrent problem. The current episode started more than 1 year ago. The problem occurs constantly. The problem has been waxing and waning. The quality of the pain is described as aching. The pain is at a severity of 8/10. Associated symptoms include tingling. Pertinent negatives include no joint swelling, limited range of motion or stiffness. The symptoms are aggravated by activity. She has tried rest and oral narcotics for the symptoms. The treatment provided mild relief.  Sinusitis  This is a new problem. The current episode started in the past 7 days. The problem has been gradually worsening since onset. Her pain is at a severity of 8/10. The pain is moderate. Associated symptoms include headaches and sinus pressure. Pertinent negatives include no chills, congestion, coughing, ear pain, hoarse voice, shortness of breath, sneezing or sore throat. Treatments tried: Comptrollerallegra and maxalt. The treatment provided mild relief.      Review of Systems  Constitutional: Negative for chills.  HENT: Positive for sinus pressure. Negative for congestion, ear pain, hoarse voice, sneezing and sore throat.   Respiratory: Negative for cough and shortness of breath.   Musculoskeletal: Negative for stiffness.  Neurological: Positive for tingling and headaches.  All other systems reviewed and are negative.      Objective:   Physical Exam  Constitutional: She is oriented to person, place, and time. She appears well-developed and well-nourished. No distress.  HENT:  Head: Normocephalic and atraumatic.  Right Ear: External ear normal.  Left Ear: External ear normal.  Mouth/Throat: Oropharynx is clear and moist.   Nasal passage erythemas with mild swelling   Eyes: Pupils are equal, round, and reactive to light.  Neck: Normal range of motion. Neck supple. No thyromegaly present.  Cardiovascular: Normal rate, regular rhythm, normal heart sounds and intact distal pulses.   No murmur heard. Pulmonary/Chest: Effort normal and breath sounds normal. No respiratory distress. She has no wheezes.  Abdominal: Soft. Bowel sounds are normal. She exhibits no distension. There is no tenderness.  Musculoskeletal: Normal range of motion. She exhibits no edema or tenderness.  Neurological: She is alert and oriented to person, place, and time.  Skin: Skin is warm and dry.  Psychiatric: She has a normal mood and affect. Her behavior is normal. Judgment and thought content normal.  Vitals reviewed.     BP 109/69 (BP Location: Right Arm, Patient Position: Sitting, Cuff Size: Normal)   Pulse 72   Temp 98.7 F (37.1 C) (Oral)   Ht 5\' 7"  (1.702 m)   Wt 146 lb 3.2 oz (66.3 kg)   LMP 03/14/2016 (Approximate)   BMI 22.90 kg/m      Assessment & Plan:  1. Other migraine without status migrainosus, not intractable -Continue allegra daily -Encouraged to start flonase daily -Stress management discussed - ketorolac (TORADOL) injection 60 mg; Inject 2 mLs (60 mg total) into the muscle once.  2. Left shoulder pain -Keep appts with Ortho and PT  -ROM exercises encouraged RTO prn - ketorolac (TORADOL) injection 60 mg; Inject 2 mLs (60 mg total) into the muscle once.  Jeanette Rodneyhristy Welda Azzarello, FNP

## 2016-03-21 NOTE — Patient Instructions (Signed)

## 2016-03-22 ENCOUNTER — Other Ambulatory Visit: Payer: Self-pay | Admitting: Pediatrics

## 2016-07-07 ENCOUNTER — Other Ambulatory Visit: Payer: Self-pay | Admitting: Pediatrics

## 2018-01-07 ENCOUNTER — Encounter: Payer: Self-pay | Admitting: Physician Assistant

## 2018-01-07 ENCOUNTER — Ambulatory Visit (INDEPENDENT_AMBULATORY_CARE_PROVIDER_SITE_OTHER): Payer: BLUE CROSS/BLUE SHIELD | Admitting: Physician Assistant

## 2018-01-07 VITALS — BP 118/71 | HR 108 | Temp 97.8°F | Ht 67.0 in | Wt 155.0 lb

## 2018-01-07 DIAGNOSIS — L03311 Cellulitis of abdominal wall: Secondary | ICD-10-CM

## 2018-01-07 DIAGNOSIS — G8929 Other chronic pain: Secondary | ICD-10-CM

## 2018-01-07 DIAGNOSIS — M25512 Pain in left shoulder: Secondary | ICD-10-CM | POA: Diagnosis not present

## 2018-01-07 MED ORDER — DICLOFENAC SODIUM 1 % TD GEL: 2.0000 g | Freq: Four times a day (QID) | TRANSDERMAL | 2 refills | Status: AC

## 2018-01-07 MED ORDER — DOXYCYCLINE HYCLATE 100 MG PO TABS: 100.0000 mg | ORAL_TABLET | Freq: Two times a day (BID) | ORAL | 1 refills | Status: AC

## 2018-01-08 NOTE — Progress Notes (Signed)
BP 118/71   Pulse (!) 108   Temp 97.8 F (36.6 C) (Oral)   Ht 5\' 7"  (1.702 m)   Wt 155 lb (70.3 kg)   BMI 24.28 kg/m     Subjective:    Patient ID: Jeanette Gomez, female    DOB: 1980/07/25, 37 y.o.   MRN: [id number]  HPI: Jeanette Gomez is a 37 y.o. female presenting on 01/07/2018 for Nevus (on right side and tender )  This patient comes in because she has a mole on her right side that is very tender.  It is at the rib border on the anterior portion of her abdomen.  It did drain some pus at one time.  It did have a lot of redness around it but now it is much smaller.  It is having a hard time healing because she bends and her bra will rub it they are often. She does need refills on her diclofenac for her shoulder.  It has done very well for her and she would like to continue to have some as needed.  Past Medical History:  Diagnosis Date  . Neuromuscular disorder (HCC)    myofaciasal pain  . PUD (peptic ulcer disease)   . Seizures (HCC)    Relevant past medical, surgical, family and social history reviewed and updated as indicated. Interim medical history since our last visit reviewed. Allergies and medications reviewed and updated. DATA REVIEWED: CHART IN EPIC  Family History reviewed for pertinent findings.  Review of Systems  Constitutional: Negative.   HENT: Negative.   Eyes: Negative.   Respiratory: Negative.   Gastrointestinal: Negative.   Genitourinary: Negative.   Musculoskeletal: Positive for arthralgias.  Skin: Positive for wound.    Allergies as of 01/07/2018      Reactions   Penicillins       Medication List        Accurate as of 01/07/18 11:59 PM. Always use your most recent med list.          diclofenac sodium 1 % Gel Commonly known as:  VOLTAREN Apply 2 g topically 4 (four) times daily.   doxycycline 100 MG tablet Commonly known as:  VIBRA-TABS Take 1 tablet (100 mg total) by mouth 2 (two) times daily. 1 po bid   fexofenadine 180 MG  tablet Commonly known as:  ALLEGRA Take 180 mg by mouth daily.   nortriptyline 25 MG capsule Commonly known as:  PAMELOR 1 AT BED FOR 7 DAYS,THEN 2 AT BED FOR 7,THEN 3 AT BEDTIME   rizatriptan 10 MG tablet Commonly known as:  MAXALT TAKE 1 TABLET (10 MG TOTAL) BY MOUTH AS NEEDED FOR MIGRAINE. MAY REPEAT IN 2 HOURS IF NEEDED   traMADol 50 MG tablet Commonly known as:  ULTRAM Take 100 mg by mouth every 4 (four) hours as needed for pain.          Objective:    BP 118/71   Pulse (!) 108   Temp 97.8 F (36.6 C) (Oral)   Ht 5\' 7"  (1.702 m)   Wt 155 lb (70.3 kg)   BMI 24.28 kg/m    Allergies  Allergen Reactions  . Penicillins     Wt Readings from Last 3 Encounters:  01/07/18 155 lb (70.3 kg)  03/21/16 146 lb 3.2 oz (66.3 kg)  02/10/16 143 lb (64.9 kg)    Physical Exam  Constitutional: She is oriented to person, place, and time. She appears well-developed and well-nourished.  HENT:  Head: Normocephalic and atraumatic.  Eyes: Pupils are equal, round, and reactive to light. Conjunctivae and EOM are normal.  Cardiovascular: Normal rate, regular rhythm, normal heart sounds and intact distal pulses.  Pulmonary/Chest: Effort normal and breath sounds normal.  Abdominal: Soft. Bowel sounds are normal.  Musculoskeletal:       Left shoulder: She exhibits decreased range of motion and pain. She exhibits no deformity.  Neurological: She is alert and oriented to person, place, and time. She has normal reflexes.  Skin: Skin is warm and dry. Lesion and rash noted. Rash is papular.     Psychiatric: She has a normal mood and affect. Her behavior is normal. Judgment and thought content normal.    Results for orders placed or performed in visit on 06/14/15  Ferritin  Result Value Ref Range   Ferritin 31 15 - 150 ng/mL      Assessment & Plan:   1. Cellulitis of abdominal wall - doxycycline (VIBRA-TABS) 100 MG tablet; Take 1 tablet (100 mg total) by mouth 2 (two) times daily. 1  po bid  Dispense: 20 tablet; Refill: 1  2. Chronic left shoulder pain - nortriptyline (PAMELOR) 25 MG capsule; 1 AT BED FOR 7 DAYS,THEN 2 AT BED FOR 7,THEN 3 AT BEDTIME - diclofenac sodium (VOLTAREN) 1 % GEL; Apply 2 g topically 4 (four) times daily.  Dispense: 100 g; Refill: 2    Continue all other maintenance medications as listed above.  Follow up plan: No follow-ups on file.  Educational handout given for survey  Remus LofflerAngel S. Hercules Hasler PA-C [facility] [address]  [place], [place] [address] [phone]   01/08/2018, 1:36 PM

## 2019-10-02 ENCOUNTER — Ambulatory Visit: Payer: BLUE CROSS/BLUE SHIELD | Attending: Internal Medicine

## 2019-10-02 DIAGNOSIS — Z23 Encounter for immunization: Secondary | ICD-10-CM

## 2019-10-02 NOTE — Progress Notes (Signed)
   Covid-19 Vaccination Clinic  Name:  Jeanette Gomez    MRN: [id number] DOB: 19-Jan-1981  10/02/2019  [name] was observed post Covid-19 immunization for 15 minutes without incident. She was provided with Vaccine Information Sheet and instruction to access the V-Safe system.   Ms. Both was instructed to call 911 with any severe reactions post vaccine: Marland Kitchen Difficulty breathing  . Swelling of face and throat  . A fast heartbeat  . A bad rash all over body  . Dizziness and weakness   Immunizations Administered    Name Date Dose VIS Date Route   Moderna COVID-19 Vaccine 10/02/2019  1:33 PM 0.5 mL 06/10/2019 Intramuscular   Manufacturer: Moderna   Lot: [id number]   NDC: 59163-846-65

## 2019-10-30 ENCOUNTER — Ambulatory Visit: Payer: BLUE CROSS/BLUE SHIELD | Attending: Internal Medicine

## 2019-10-30 DIAGNOSIS — Z23 Encounter for immunization: Secondary | ICD-10-CM

## 2019-10-30 NOTE — Progress Notes (Signed)
   [facility] Vaccination [facility]  Name:  Jeanette Gomez    MRN: [id number] DOB: 1981/06/24  10/30/2019  [name] was observed post Covid-19 immunization for 15 minutes without incident. She was provided with Vaccine Information Sheet and instruction to access the V-Safe system.   [name] was instructed to call 911 with any severe reactions post vaccine: Marland Kitchen Difficulty breathing  . Swelling of face and throat  . A fast heartbeat  . A bad rash all over body  . Dizziness and weakness   Immunizations Administered    Name Date Dose VIS Date Route   Moderna COVID-19 Vaccine 10/30/2019  1:35 PM 0.5 mL 06/2019 Intramuscular   Manufacturer: Moderna   Lot: [id number]   NDC: 46568-127-51
